# Patient Record
Sex: Male | Born: 1990 | Race: White | Hispanic: No | Marital: Married | Smoking: Former smoker
Health system: Southern US, Community
[De-identification: ages and names within clinical notes are randomized; demographics above are authoritative.]

## PROBLEM LIST (undated history)

## (undated) DIAGNOSIS — F32A Depression, unspecified: Secondary | ICD-10-CM

## (undated) DIAGNOSIS — I10 Essential (primary) hypertension: Secondary | ICD-10-CM

## (undated) DIAGNOSIS — E785 Hyperlipidemia, unspecified: Secondary | ICD-10-CM

## (undated) DIAGNOSIS — Z8701 Personal history of pneumonia (recurrent): Secondary | ICD-10-CM

## (undated) DIAGNOSIS — F329 Major depressive disorder, single episode, unspecified: Secondary | ICD-10-CM

## (undated) DIAGNOSIS — K760 Fatty (change of) liver, not elsewhere classified: Secondary | ICD-10-CM

## (undated) HISTORY — DX: Personal history of pneumonia (recurrent): Z87.01

## (undated) HISTORY — DX: Depression, unspecified: F32.A

## (undated) HISTORY — DX: Hyperlipidemia, unspecified: E78.5

## (undated) HISTORY — DX: Fatty (change of) liver, not elsewhere classified: K76.0

## (undated) HISTORY — DX: Major depressive disorder, single episode, unspecified: F32.9

## (undated) HISTORY — DX: Essential (primary) hypertension: I10

---

## 2014-05-12 ENCOUNTER — Encounter: Payer: Self-pay | Admitting: Family Medicine

## 2014-05-29 ENCOUNTER — Ambulatory Visit: Payer: BLUE CROSS/BLUE SHIELD | Admitting: Physician Assistant

## 2014-06-05 ENCOUNTER — Encounter: Payer: Self-pay | Admitting: Physician Assistant

## 2014-06-05 ENCOUNTER — Other Ambulatory Visit: Payer: Self-pay | Admitting: Physician Assistant

## 2014-06-05 ENCOUNTER — Ambulatory Visit (INDEPENDENT_AMBULATORY_CARE_PROVIDER_SITE_OTHER): Payer: BLUE CROSS/BLUE SHIELD | Admitting: Physician Assistant

## 2014-06-05 VITALS — BP 122/94 | HR 76 | Temp 98.3°F | Resp 18 | Ht >= 80 in | Wt >= 6400 oz

## 2014-06-05 DIAGNOSIS — F32A Depression, unspecified: Secondary | ICD-10-CM | POA: Insufficient documentation

## 2014-06-05 DIAGNOSIS — E669 Obesity, unspecified: Secondary | ICD-10-CM

## 2014-06-05 DIAGNOSIS — G4733 Obstructive sleep apnea (adult) (pediatric): Secondary | ICD-10-CM

## 2014-06-05 DIAGNOSIS — F329 Major depressive disorder, single episode, unspecified: Secondary | ICD-10-CM | POA: Insufficient documentation

## 2014-06-05 DIAGNOSIS — Z23 Encounter for immunization: Secondary | ICD-10-CM

## 2014-06-05 DIAGNOSIS — K219 Gastro-esophageal reflux disease without esophagitis: Secondary | ICD-10-CM | POA: Insufficient documentation

## 2014-06-05 DIAGNOSIS — Z0000 Encounter for general adult medical examination without abnormal findings: Secondary | ICD-10-CM

## 2014-06-05 LAB — CBC WITH DIFFERENTIAL/PLATELET
BASOS ABS: 0.1 10*3/uL (ref 0.0–0.1)
BASOS PCT: 1 % (ref 0–1)
Eosinophils Absolute: 0.1 10*3/uL (ref 0.0–0.7)
Eosinophils Relative: 2 % (ref 0–5)
HEMATOCRIT: 47.4 % (ref 39.0–52.0)
Hemoglobin: 16.2 g/dL (ref 13.0–17.0)
LYMPHS ABS: 2.4 10*3/uL (ref 0.7–4.0)
LYMPHS PCT: 47 % — AB (ref 12–46)
MCH: 28.6 pg (ref 26.0–34.0)
MCHC: 34.2 g/dL (ref 30.0–36.0)
MCV: 83.6 fL (ref 78.0–100.0)
MPV: 9.3 fL (ref 8.6–12.4)
Monocytes Absolute: 0.5 10*3/uL (ref 0.1–1.0)
Monocytes Relative: 9 % (ref 3–12)
NEUTROS PCT: 41 % — AB (ref 43–77)
Neutro Abs: 2.1 10*3/uL (ref 1.7–7.7)
Platelets: 233 10*3/uL (ref 150–400)
RBC: 5.67 MIL/uL (ref 4.22–5.81)
RDW: 13.9 % (ref 11.5–15.5)
WBC: 5.2 10*3/uL (ref 4.0–10.5)

## 2014-06-05 LAB — COMPLETE METABOLIC PANEL WITH GFR
ALBUMIN: 4.5 g/dL (ref 3.5–5.2)
ALK PHOS: 59 U/L (ref 39–117)
ALT: 92 U/L — AB (ref 0–53)
AST: 50 U/L — ABNORMAL HIGH (ref 0–37)
BUN: 15 mg/dL (ref 6–23)
CALCIUM: 9.5 mg/dL (ref 8.4–10.5)
CO2: 27 meq/L (ref 19–32)
Chloride: 107 mEq/L (ref 96–112)
Creat: 1.11 mg/dL (ref 0.50–1.35)
GFR, Est African American: >89 mL/min
GFR, Est Non African American: >89 mL/min
GLUCOSE: 89 mg/dL (ref 70–99)
POTASSIUM: 4.9 meq/L (ref 3.5–5.3)
SODIUM: 142 meq/L (ref 135–145)
Total Bilirubin: 0.6 mg/dL (ref 0.2–1.2)
Total Protein: 6.8 g/dL (ref 6.0–8.3)

## 2014-06-05 LAB — LIPID PANEL
Cholesterol: 204 mg/dL — ABNORMAL HIGH (ref 0–200)
HDL: 40 mg/dL (ref >39)
LDL Cholesterol: 142 mg/dL — ABNORMAL HIGH (ref 0–99)
Total CHOL/HDL Ratio: 5.1 Ratio
Triglycerides: 108 mg/dL (ref <150)
VLDL: 22 mg/dL (ref 0–40)

## 2014-06-05 MED ORDER — PAROXETINE HCL 20 MG PO TABS: 20.0000 mg | ORAL_TABLET | Freq: Every day | ORAL | Status: DC

## 2014-06-05 MED ORDER — SERTRALINE HCL 50 MG PO TABS: 50.0000 mg | ORAL_TABLET | Freq: Every day | ORAL | Status: DC

## 2014-06-05 MED ORDER — OMEPRAZOLE 20 MG PO CPDR: 20.0000 mg | DELAYED_RELEASE_CAPSULE | Freq: Every day | ORAL | Status: DC

## 2014-06-05 NOTE — Progress Notes (Signed)
Patient ID: Roberto [name] MRN: [id number], DOB: Sep 28, 1990 24 y.o. Date of Encounter: 06/05/2014, 11:07 AM    Chief Complaint: Physical (CPE)  HPI: 24 y.o. y/o white male here for CPE. He is also here as a new patient to establish care. As well he has several issues that he wanted to discuss today.  Today he is here with his girlfriend who I have seen as a patient recently.  One thing patient wants to discuss is depression. He says that it runs in his family and his mother and his brother both are on medications for depression.  Says that at times he will have decreased interest in doing things and decreased motivation to do things. He says that also he is easily angered and easily irritated by people doing "stupid stuff". Girlfriend states that he will sometimes get upset over one thing and then he will start getting upset about his life in general.  Says he then will want to go be by himself and not be involved with things much during that time. He then says that one example would be that it may be towards the end of the month when he doesn't have very much money so then he will start thinking that he doesn't like his job--- then he will start generalizing his thoughts to that nothing seems to be going his way, nothing is going to work out like he wants to. He will start feeling like everything is wrong. This will then "stress him out "and cause stress to the girlfriend and other family etc. Says that he will be this way for a week or 2 but then will go back to being "normal and chilled out" for period of time.  Patient states that he is also having heartburn symptoms. Says that often happens when he is at work. He has tried taking Tums and Rolaids without relief. Says that he does usually eat fast food. However it doesn't seem to really matter exactly which thing he eats-- regardless he seems to have a heartburn after eating.  He also wants to have evaluation because he thinks he may have  sleep apnea. He knows he snores loudly. Also he recently went to an urgent care because of a sore throat and they had felt that he may have sleep apnea but did not schedule any further evaluation and told him to follow-up here with that.   Review of Systems: Consitutional: No fever, chills, fatigue, night sweats, lymphadenopathy, or weight changes. Eyes: No visual changes, eye redness, or discharge. ENT/Mouth: Ears: No otalgia, tinnitus, hearing loss, discharge. Nose: No congestion, rhinorrhea, sinus pain, or epistaxis. Throat: No sore throat, post nasal drip, or teeth pain. Cardiovascular: No CP, palpitations, diaphoresis, DOE, edema, orthopnea, PND. Respiratory: No cough, hemoptysis, SOB, or wheezing. Gastrointestinal: No anorexia, dysphagia, nausea, vomiting, hematemesis, diarrhea, constipation, BRBPR, or melena. +for GERD--see HPI Genitourinary: No dysuria, frequency, urgency, hematuria, incontinence, nocturia, decreased urinary stream, discharge, impotence, or testicular pain/masses. Musculoskeletal: No decreased ROM, myalgias, stiffness, joint swelling, or weakness. Skin: No rash, erythema, lesion changes, pain, warmth, jaundice, or pruritis. Neurological: No headache, dizziness, syncope, seizures, tremors, memory loss, coordination problems, or paresthesias. Psychological: No anxiety, hallucinations, SI/HI.--+ for depression--see HPI Endocrine: No fatigue, polydipsia, polyphagia, polyuria, or known diabetes. All other systems were reviewed and are otherwise negative.  Past Medical History  Diagnosis Date  . H/O: pneumonia     as infant     History reviewed. No pertinent past surgical history.  Home Meds:  Outpatient Prescriptions Prior to Visit  Medication Sig Dispense Refill  . Acetaminophen-Caffeine (EXCEDRIN TENSION HEADACHE) 500-65 MG TABS Take by mouth.     No facility-administered medications prior to visit.    Allergies: No Known Allergies  History   Social History   . Marital Status: Single    Spouse Name: N/A    Number of Children: N/A  . Years of Education: N/A   Occupational History  . Not on file.   Social History Main Topics  . Smoking status: Former Smoker    Types: E-cigarettes    Quit date: 03/31/2014  . Smokeless tobacco: Never Used  . Alcohol Use: Yes  . Drug Use: No  . Sexual Activity: No   Other Topics Concern  . Not on file   Social History Narrative   Works Office manager.    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 Depression Mother   . Hearing loss Mother   . Hyperlipidemia Mother   . Hypertension Mother   . Miscarriages / [place] Mother   . Diabetes Father   . Hearing loss Father   . Hyperlipidemia Father   . Hypertension Father   . Alcohol abuse Brother   . Depression Brother   . Drug abuse Brother   . Depression Maternal Uncle   . Arthritis Maternal Grandmother   . Cancer Maternal Grandmother   . Depression Maternal Grandmother   . Hearing loss Maternal Grandmother   . Heart disease Maternal Grandmother   . Hypertension Maternal Grandmother   . Depression Maternal Grandfather   . Cancer Paternal Grandmother   . Heart disease Paternal Grandmother   . Alcohol abuse Paternal Grandfather   . Cancer Paternal Grandfather   . Heart disease Paternal Grandfather   . Hyperlipidemia Paternal Grandfather   . Hypertension Paternal Grandfather   . Miscarriages / Stillbirths Paternal Grandfather     retinitis pigmentosis    Physical Exam: Blood pressure 122/94, pulse 76, temperature 98.3 F (36.8 C), temperature source Oral, resp. rate 18, height  (2.057 m), weight 400 lb (181.439 kg).  General: Obese WM. Appears in no acute distress. HEENT: Normocephalic, atraumatic. Conjunctiva pink, sclera non-icteric. Pupils 2 mm constricting to 1 mm, round, regular, and equally reactive to light and accomodation. EOMI. Internal auditory canal  clear. TMs with good cone of light and without pathology. Nasal mucosa pink. Nares are without discharge. No sinus tenderness. Oral mucosa pink. Dentition normal. Pharynx without exudate.   Neck: Supple. Trachea midline. No thyromegaly. Full ROM. No lymphadenopathy. Lungs: Clear to auscultation bilaterally without wheezes, rales, or rhonchi. Breathing is of normal effort and unlabored. Cardiovascular: RRR with S1 S2. No murmurs, rubs, or gallops. Distal pulses 2+ symmetrically. No carotid or abdominal bruits. Abdomen: Soft, non-tender, non-distended with normoactive bowel sounds. No hepatosplenomegaly or masses. No rebound/guarding. No CVA tenderness. No hernias. Musculoskeletal: Full range of motion and 5/5 strength throughout. Skin: Warm and moist without erythema, ecchymosis, wounds, or rash. Neuro: A+Ox3. CN II-XII grossly intact. Moves all extremities spontaneously. Full sensation throughout. Normal gait. Psych:  Responds to questions appropriately with a normal affect.   Assessment/Plan:  24 y.o. y/o white male here for CPE  -1. Visit for preventive health examination  A. Screening Labs: - CBC with Differential/Platelet - COMPLETE METABOLIC PANEL WITH GFR - Lipid panel   B. Immunizations: Flu---------------- patient defers. Says that he "never gets a flu shot " Tetanus-----------  patient reports that last tetanus was probably 10 years ago. Agreeable to update today.--Given here 06/05/2014 Pneumococcal-----no indication to need this prior to age 24 Zostavax------------- not indicated until age 24     2. Depression He states that his brother is on Paxil.  Patient,  Himself,  has never been on any medications in the past to know how they affect him. Given that Paxil does work for his brother and does not cause his brother adverse effects, weill go ahead with using Paxil for patient. Explained to patient that if he thinks medication is causing any adverse effects to call us  immediately. Otherwise, even if he does not think medication is helping, continue taking it daily until his follow-up appointment in 6 weeks. Discussed proper expectations of medication at length. - PARoxetine (PAXIL) 20 MG tablet; Take 1 tablet (20 mg total) by mouth daily.  Dispense: 30 tablet; Refill: 1  3. Obstructive sleep apnea - Nocturnal polysomnography (NPSG); Future  4. Gastroesophageal reflux disease, esophagitis presence not specified Discussed trying to avoid fried spicy foods and other foods that seem to irritate his reflux. Otherwise can use omeprazole as needed. - omeprazole (PRILOSEC) 20 MG capsule; Take 1 capsule (20 mg total) by mouth daily.  Dispense: 30 capsule; Refill: 3  5. Obesity Did not discuss his weight, diet and exercise at his initial visit. Will discuss this further at his follow-up visit.  6. Need for Tdap vaccination - Tdap vaccine greater than or equal to 7yo IM   Follow-up office visit in 6 weeks or sooner if needed.  Signed:   [address]Mary Beth ArdmoreDixon,[place], [place]  06/05/2014 11:07 AM

## 2014-06-07 ENCOUNTER — Telehealth: Payer: Self-pay | Admitting: Family Medicine

## 2014-06-07 DIAGNOSIS — R7989 Other specified abnormal findings of blood chemistry: Secondary | ICD-10-CM

## 2014-06-07 DIAGNOSIS — R945 Abnormal results of liver function studies: Principal | ICD-10-CM

## 2014-06-07 LAB — HEPATITIS PANEL, ACUTE
HCV Ab: NEGATIVE
HEP A IGM: NONREACTIVE
HEP B C IGM: NONREACTIVE
Hepatitis B Surface Ag: NEGATIVE

## 2014-06-07 NOTE — Telephone Encounter (Signed)
Pt aware of lab results and provider recommendations.  Order for US entered.

## 2014-06-07 NOTE — Telephone Encounter (Signed)
-----   Message from Dorena BodoMary B Dixon, PA-C sent at 06/07/2014  7:52 AM EST ----- Elevated liver function test are most likely secondary to patient's obesity  (weight 400 pounds)--causing fatty liver. Since this has not been evaluated, will have lab add hepatitis panel and will go ahead and order liver ultrasound. I will talk to the lab about adding hepatitis panel. Jaclin Finks-- Please Place order for ultrasound right upper quadrant. Tell patient to decrease saturated fats in diet. Needs to eat lean meats and vegetables. Add exercise if possible. Other labs are ok.

## 2014-06-12 ENCOUNTER — Ambulatory Visit
Admission: RE | Admit: 2014-06-12 | Discharge: 2014-06-12 | Disposition: A | Discharge status: Home / Self Care | Payer: BC Managed Care – PPO | Source: Ambulatory Visit | Attending: Physician Assistant | Admitting: Physician Assistant

## 2014-06-12 ENCOUNTER — Encounter: Payer: Self-pay | Admitting: Family Medicine

## 2014-06-12 DIAGNOSIS — R7989 Other specified abnormal findings of blood chemistry: Secondary | ICD-10-CM | POA: Insufficient documentation

## 2014-06-12 DIAGNOSIS — R945 Abnormal results of liver function studies: Secondary | ICD-10-CM

## 2014-07-17 ENCOUNTER — Ambulatory Visit: Payer: BLUE CROSS/BLUE SHIELD | Admitting: Physician Assistant

## 2014-07-17 ENCOUNTER — Ambulatory Visit: Payer: BC Managed Care – PPO | Admitting: Family Medicine

## 2014-07-27 ENCOUNTER — Encounter: Payer: Self-pay | Admitting: Family Medicine

## 2014-07-27 ENCOUNTER — Ambulatory Visit (INDEPENDENT_AMBULATORY_CARE_PROVIDER_SITE_OTHER): Payer: BC Managed Care – PPO | Admitting: Family Medicine

## 2014-07-27 VITALS — BP 124/78 | HR 78 | Temp 98.3°F | Resp 16 | Ht >= 80 in | Wt 399.0 lb

## 2014-07-27 DIAGNOSIS — F32A Depression, unspecified: Secondary | ICD-10-CM

## 2014-07-27 DIAGNOSIS — M7042 Prepatellar bursitis, left knee: Secondary | ICD-10-CM | POA: Diagnosis not present

## 2014-07-27 DIAGNOSIS — K219 Gastro-esophageal reflux disease without esophagitis: Secondary | ICD-10-CM

## 2014-07-27 DIAGNOSIS — F329 Major depressive disorder, single episode, unspecified: Secondary | ICD-10-CM

## 2014-07-27 MED ORDER — DICLOFENAC SODIUM 75 MG PO TBEC: 75.0000 mg | DELAYED_RELEASE_TABLET | Freq: Two times a day (BID) | ORAL | Status: DC

## 2014-07-27 NOTE — Progress Notes (Signed)
Subjective:    Patient ID: Roberto MoynahanDavid [name], male    DOB: 06/17/1990, 24 y.o.   MRN: [id number]  HPI  Patient is here today for follow-up. When he last saw Shon HaleMary Beth 6 weeks ago he was started on Paxil 20 mg a day for depression. He states he is doing some better. He continues to complain of low energy, apathy, and lack of desire to do anything. Otherwise the depression is slightly better.  Patient injured his left knee in January in a snowboarding accident.  Patient has a localized area of fluid just anterior to the patella. Patient has swelling in the prepatellar bursa. There is no swelling in the knee joint itself. There is no bruising in the knee joint. There is no pain with range of motion in the knee joint. The patient denies any clicking or grinding.  Patient's acid reflux is gotten better on omeprazole. Past Medical History  Diagnosis Date  . H/O: pneumonia     as infant   No past surgical history on file. Current Outpatient Prescriptions on File Prior to Visit  Medication Sig Dispense Refill  . Acetaminophen-Caffeine (EXCEDRIN TENSION HEADACHE) 500-65 MG TABS Take by mouth.    Marland Kitchen. omeprazole (PRILOSEC) 20 MG capsule Take 1 capsule (20 mg total) by mouth daily. 30 capsule 3  . PARoxetine (PAXIL) 20 MG tablet Take 1 tablet (20 mg total) by mouth daily. 30 tablet 1   No current facility-administered medications on file prior to visit.   No Known Allergies History   Social History  . Marital Status: Single    Spouse Name: N/A  . Number of Children: N/A  . Years of Education: N/A   Occupational History  . Not on file.   Social History Main Topics  . Smoking status: Former Smoker    Types: E-cigarettes    Quit date: 03/31/2014  . Smokeless tobacco: Never Used  . Alcohol Use: Yes  . Drug Use: No  . Sexual Activity: No   Other Topics Concern  . Not on file   Social History Narrative   Works Office managerDesk Job.    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      Objective:   Physical Exam  Cardiovascular: Normal rate, regular rhythm and normal heart sounds.   No murmur heard. Pulmonary/Chest: Effort normal and breath sounds normal. No respiratory distress. He has no wheezes. He has no rales.  Abdominal: Soft. Bowel sounds are normal. He exhibits no distension. There is no tenderness. There is no rebound.  Musculoskeletal:       Left knee: He exhibits swelling. He exhibits normal range of motion, no effusion, no deformity, normal alignment, no LCL laxity, normal patellar mobility, no bony tenderness, normal meniscus and no MCL laxity. No medial joint line, no lateral joint line, no MCL and no LCL tenderness noted.  Vitals reviewed.         Assessment & Plan:  Prepatellar bursitis, left - Plan: diclofenac (VOLTAREN) 75 MG EC tablet  Gastroesophageal reflux disease without esophagitis  Depression  A she has prepatellar bursitis. I will treat that with diclofenac 75 mg by mouth twice a day. Recheck in 2-3 weeks if no better for possible cortisone injection and aspiration of the prepatellar bursa. Continue omeprazole 20 mg by mouth daily for acid reflux. Also recommended weight loss. The patient will also continue Paxil 20 mg by mouth  daily for the next 2-4 weeks. If no better at that point I would switch the patient to Effexor XR to try to improve his energy and better manage his apathy

## 2014-08-07 ENCOUNTER — Telehealth: Payer: Self-pay | Admitting: Physician Assistant

## 2014-08-07 MED ORDER — VENLAFAXINE HCL ER 75 MG PO CP24: ORAL_CAPSULE | ORAL | Status: DC

## 2014-08-07 NOTE — Telephone Encounter (Signed)
Switch from Paxil 20 mg a day to Effexor XR 75 mg a day. Take the pill in the morning. In 2 weeks increase to 150 mg a day. Recheck here in one month

## 2014-08-07 NOTE — Telephone Encounter (Signed)
Roberto [name]   Patient is calling to get rx for his depression, however he was taking paxil, and as mentioned in ov notes from [facility] thinks he would like to switch to Effexor if possible  [phone]

## 2014-08-07 NOTE — Telephone Encounter (Signed)
Pt would like to switch to Effexor as discussed at last OV.  Please advise?

## 2014-08-07 NOTE — Telephone Encounter (Signed)
Spoke to patient.  Aware of medication change and how to take.  One month follow up appt made.  Rx to pharmacy

## 2014-08-20 ENCOUNTER — Ambulatory Visit (HOSPITAL_BASED_OUTPATIENT_CLINIC_OR_DEPARTMENT_OTHER): Payer: BC Managed Care – PPO | Attending: Physician Assistant | Admitting: Radiology

## 2014-08-20 VITALS — Ht >= 80 in | Wt >= 6400 oz

## 2014-08-20 DIAGNOSIS — G4733 Obstructive sleep apnea (adult) (pediatric): Secondary | ICD-10-CM

## 2014-08-20 DIAGNOSIS — R0683 Snoring: Secondary | ICD-10-CM | POA: Diagnosis not present

## 2014-08-20 DIAGNOSIS — G4710 Hypersomnia, unspecified: Secondary | ICD-10-CM | POA: Insufficient documentation

## 2014-08-25 DIAGNOSIS — G4733 Obstructive sleep apnea (adult) (pediatric): Secondary | ICD-10-CM | POA: Diagnosis not present

## 2014-08-26 NOTE — Sleep Study (Signed)
   NAME: Letta MoynahanDavid Lafortune DATE OF BIRTH:  08/20/1990 MEDICAL RECORD NUMBER [id number]  LOCATION: [facility]  PHYSICIAN: Alven Alverio D  DATE OF STUDY: 08/20/2014  SLEEP STUDY TYPE: Nocturnal Polysomnogram               REFERRING PHYSICIAN: Allayne Butcherixon, Mary B, PA-C  INDICATION FOR STUDY: Hypersomnia with sleep apnea  EPWORTH SLEEPINESS SCORE:   19/24 HEIGHT: 6\' 9"  (205.7 cm)  WEIGHT: (!) 400 lb (181.439 kg)    Body mass index is 42.88 kg/(m^2).  NECK SIZE: 17 in.  MEDICATIONS: Charted for review  SLEEP ARCHITECTURE: Total sleep time 287.5 minutes with sleep efficiency 73.7%. Stage I was 17.9%, stage II 76.9%, stage III 5.2%, REM absent. Sleep latency 31.5 minutes, awake after sleep onset 66.5 minutes, arousal index 33.6, bedtime medication: None  RESPIRATORY DATA: Apnea hypopnea index (AHI) 2.3 per hour. 11 total events scored, all as hypopneas while supine.  OXYGEN DATA: Moderately loud snoring with oxygen desaturation to a nadir of 90% and mean saturation 94.1% on room air  CARDIAC DATA: Normal sinus rhythm  MOVEMENT/PARASOMNIA: No significant movement disturbance, bathroom 1. One episode of bruxism.  IMPRESSION/ RECOMMENDATION:   1) Sleep architecture was somewhat fragmented by frequent brief awakenings throughout the study, unrelated to respiratory or movement disturbance. Consider management as insomnia. 2) Occasional respiratory event with sleep disturbance, within normal limits. AHI 2.3 per hour representing a few supine hypopneas. Moderately loud snoring with oxygen desaturation to a nadir of 90% and mean saturation 94.1% on room air   Waymon BudgeYOUNG,Netasha Wehrli D Diplomate, American Board of Sleep Medicine  ELECTRONICALLY SIGNED ON:  08/26/2014, 1:55 PM [facility] PH: [phone]   FX: [phone] ACCREDITED BY THE AMERICAN ACADEMY OF SLEEP MEDICINE

## 2014-08-30 ENCOUNTER — Telehealth: Payer: Self-pay | Admitting: Family Medicine

## 2014-08-30 NOTE — Telephone Encounter (Signed)
Pt aware of Sleep Study results

## 2014-09-01 ENCOUNTER — Other Ambulatory Visit: Payer: Self-pay | Admitting: Family Medicine

## 2014-09-01 NOTE — Telephone Encounter (Signed)
One refill to hold until up coming appt. 

## 2014-09-05 ENCOUNTER — Ambulatory Visit (INDEPENDENT_AMBULATORY_CARE_PROVIDER_SITE_OTHER): Payer: BC Managed Care – PPO | Admitting: Family Medicine

## 2014-09-05 ENCOUNTER — Encounter: Payer: Self-pay | Admitting: Family Medicine

## 2014-09-05 VITALS — BP 140/90 | HR 88 | Temp 98.4°F | Resp 16 | Ht >= 80 in | Wt 393.0 lb

## 2014-09-05 DIAGNOSIS — F329 Major depressive disorder, single episode, unspecified: Secondary | ICD-10-CM

## 2014-09-05 DIAGNOSIS — M79671 Pain in right foot: Secondary | ICD-10-CM | POA: Diagnosis not present

## 2014-09-05 DIAGNOSIS — F32A Depression, unspecified: Secondary | ICD-10-CM

## 2014-09-05 NOTE — Progress Notes (Signed)
Subjective:    Patient ID: Roberto [name], male    DOB: 08/24/1990, 24 y.o.   MRN: [id number]  HPI  07/27/14 Patient is here today for follow-up. When he last saw Shon HaleMary Beth 6 weeks ago he was started on Paxil 20 mg a day for depression. He states he is doing some better. He continues to complain of low energy, apathy, and lack of desire to do anything. Otherwise the depression is slightly better.  Patient injured his left knee in January in a snowboarding accident.  Patient has a localized area of fluid just anterior to the patella. Patient has swelling in the prepatellar bursa. There is no swelling in the knee joint itself. There is no bruising in the knee joint. There is no pain with range of motion in the knee joint. The patient denies any clicking or grinding.  Patient's acid reflux is gotten better on omeprazole.  AT that time, my plan was: He has prepatellar bursitis. I will treat that with diclofenac 75 mg by mouth twice a day. Recheck in 2-3 weeks if no better for possible cortisone injection and aspiration of the prepatellar bursa. Continue omeprazole 20 mg by mouth daily for acid reflux. Also recommended weight loss. The patient will also continue Paxil 20 mg by mouth daily for the next 2-4 weeks. If no better at that point I would switch the patient to Effexor XR to try to improve his energy and better manage his apathy.  On 4/4, the patient called and asked to switch paxil to Effexor XR 75 mg poqday with plans to increase to 150 mg poqam after two weeks.  He is here today for follow up.  09/05/14 Patient symptoms of depression have improved. His energy level has improved. Overall he is feeling much better on the Effexor. Unfortunately the frequency of his headaches have increased. He has a past medical history of migraines and he is getting more frequent also call headaches later in the day at work fortunately his sleep study was normal. There is no evidence of sleep apnea. He also continues to  complain of pain in the right first MTP joint. He denies any injury. It did not improve with diclofenac. Past Medical History  Diagnosis Date  . H/O: pneumonia     as infant   No past surgical history on file. Current Outpatient Prescriptions on File Prior to Visit  Medication Sig Dispense Refill  . Acetaminophen-Caffeine (EXCEDRIN TENSION HEADACHE) 500-65 MG TABS Take by mouth.    . diclofenac (VOLTAREN) 75 MG EC tablet Take 1 tablet (75 mg total) by mouth 2 (two) times daily. 30 tablet 0  . omeprazole (PRILOSEC) 20 MG capsule Take 1 capsule (20 mg total) by mouth daily. 30 capsule 3  . venlafaxine XR (EFFEXOR-XR) 75 MG 24 hr capsule TAKE ONE CAPSULE BY MOUTH EVERY MORNING X2 WEEK THEN INCREASE TO 2 CAPS EVERY MORNING 60 capsule 0   No current facility-administered medications on file prior to visit.   No Known Allergies History   Social History  . Marital Status: Single    Spouse Name: N/A  . Number of Children: N/A  . Years of Education: N/A   Occupational History  . Not on file.   Social History Main Topics  . Smoking status: Former Smoker    Types: E-cigarettes    Quit date: 03/31/2014  . Smokeless tobacco: Never Used  . Alcohol Use: Yes  . Drug Use: No  . Sexual Activity: No   Other  Topics Concern  . Not on file   Social History Narrative   Works Office manager.    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      Objective:   Physical Exam  Cardiovascular: Normal rate, regular rhythm and normal heart sounds.   No murmur heard. Pulmonary/Chest: Effort normal and breath sounds normal. No respiratory distress. He has no wheezes. He has no rales.  Abdominal: Soft. Bowel sounds are normal. He exhibits no distension. There is no tenderness. There is no rebound.  Vitals reviewed.         Assessment & Plan:  Depression  Foot pain, right - Plan: DG Foot Complete  Right  Patient's depression is better. Continue Effexor XR 150 mg by mouth daily. Recheck in one month. If patient continues to have daily migraines and also continues to struggle with obesity, we can try Topamax for weight loss as well as migraine prevention. I would like the patient to get an x-ray of his right foot to evaluate for the pain he is having in the right first MTP joint

## 2014-09-26 ENCOUNTER — Encounter: Payer: Self-pay | Admitting: *Deleted

## 2014-10-11 ENCOUNTER — Other Ambulatory Visit: Payer: Self-pay | Admitting: Family Medicine

## 2014-10-11 MED ORDER — VENLAFAXINE HCL ER 150 MG PO CP24: 150.0000 mg | ORAL_CAPSULE | Freq: Every day | ORAL | Status: DC

## 2014-10-11 NOTE — Telephone Encounter (Signed)
Refill appropriate and filled per protocol. 

## 2014-11-09 ENCOUNTER — Encounter: Payer: Self-pay | Admitting: Family Medicine

## 2014-11-09 ENCOUNTER — Ambulatory Visit (INDEPENDENT_AMBULATORY_CARE_PROVIDER_SITE_OTHER): Payer: BC Managed Care – PPO | Admitting: Family Medicine

## 2014-11-09 VITALS — BP 118/84 | HR 110 | Temp 98.3°F | Resp 18 | Ht >= 80 in | Wt 370.0 lb

## 2014-11-09 DIAGNOSIS — K529 Noninfective gastroenteritis and colitis, unspecified: Secondary | ICD-10-CM

## 2014-11-09 MED ORDER — DIPHENOXYLATE-ATROPINE 2.5-0.025 MG PO TABS: 2.0000 | ORAL_TABLET | Freq: Four times a day (QID) | ORAL | Status: DC | PRN

## 2014-11-09 NOTE — Progress Notes (Signed)
   Subjective:    Patient ID: Roberto [name], male    DOB: 09/27/1990, 24 y.o.   MRN: [id number]  HPI Symptoms began 3 days ago. He is having 10-15 watery bowel movements per day. He has also having some mild intestinal cramps. He also has some mild nausea. He denies any fever. He is not vomiting. He denies any melanotic or hematochezia. The symptoms seem to be improving. He denies any abdominal pain. He denies any travel. He denies drinking or eating any suspicious foods or liquids or he may have consumed fecal material inadvertently. Past Medical History  Diagnosis Date  . H/O: pneumonia     as infant   No past surgical history on file. Current Outpatient Prescriptions on File Prior to Visit  Medication Sig Dispense Refill  . Acetaminophen-Caffeine (EXCEDRIN TENSION HEADACHE) 500-65 MG TABS Take by mouth.    Marland Kitchen. omeprazole (PRILOSEC) 20 MG capsule Take 1 capsule (20 mg total) by mouth daily. 30 capsule 3  . venlafaxine XR (EFFEXOR-XR) 150 MG 24 hr capsule Take 150 mg by mouth daily with breakfast.     No current facility-administered medications on file prior to visit.   No Known Allergies History   Social History  . Marital Status: Single    Spouse Name: N/A  . Number of Children: N/A  . Years of Education: N/A   Occupational History  . Not on file.   Social History Main Topics  . Smoking status: Former Smoker    Types: E-cigarettes    Quit date: 03/31/2014  . Smokeless tobacco: Never Used  . Alcohol Use: Yes  . Drug Use: No  . Sexual Activity: No   Other Topics Concern  . Not on file   Social History Narrative   Works Office managerDesk Job.    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      Objective:   Physical Exam  Constitutional: He appears well-developed and well-nourished. No distress.  Cardiovascular: Normal rate, regular rhythm and normal heart sounds.   Pulmonary/Chest:  Effort normal and breath sounds normal. No respiratory distress. He has no wheezes. He has no rales.  Abdominal: Soft. Bowel sounds are normal. He exhibits no distension. There is no tenderness. There is no rebound and no guarding.  Skin: He is not diaphoretic.  Vitals reviewed.         Assessment & Plan:  Gastroenteritis, acute - Plan: diphenoxylate-atropine (LOMOTIL) 2.5-0.025 MG per tablet  Patient has viral gastroenteritis. I recommended a bland diet. I recommended the patient push fluids. I will give the patient Lomotil 2 tablets by mouth every 6 hours when necessary diarrhea. I anticipate gradual improvement over the next 2-3 days. Recheck immediately if worse

## 2014-11-20 ENCOUNTER — Telehealth: Payer: Self-pay | Admitting: Family Medicine

## 2014-11-20 NOTE — Telephone Encounter (Signed)
PA requested for Omeprazole  Submitted through "CoverMyMeds"  Case [id number]

## 2014-11-21 NOTE — Telephone Encounter (Signed)
Omeprazole has been approved 10/21/14 - 11/20/15.  Case # [id number].  Pharmacy made aware.

## 2014-12-29 ENCOUNTER — Other Ambulatory Visit: Payer: Self-pay | Admitting: Family Medicine

## 2014-12-29 NOTE — Telephone Encounter (Signed)
Medication refilled per protocol. 

## 2015-01-13 ENCOUNTER — Other Ambulatory Visit: Payer: Self-pay | Admitting: Physician Assistant

## 2015-01-15 NOTE — Telephone Encounter (Signed)
Medication refilled per protocol. 

## 2015-03-07 ENCOUNTER — Other Ambulatory Visit: Payer: Self-pay | Admitting: Family Medicine

## 2015-03-07 NOTE — Telephone Encounter (Signed)
Refill appropriate and filled per protocol. 

## 2015-11-10 ENCOUNTER — Other Ambulatory Visit: Payer: Self-pay | Admitting: Family Medicine

## 2016-01-10 ENCOUNTER — Encounter: Payer: Self-pay | Admitting: Family Medicine

## 2016-01-10 ENCOUNTER — Ambulatory Visit (INDEPENDENT_AMBULATORY_CARE_PROVIDER_SITE_OTHER): Payer: BC Managed Care – PPO | Admitting: Family Medicine

## 2016-01-10 VITALS — BP 144/100 | HR 84 | Temp 98.3°F | Resp 18 | Ht >= 80 in | Wt 393.0 lb

## 2016-01-10 DIAGNOSIS — F329 Major depressive disorder, single episode, unspecified: Secondary | ICD-10-CM | POA: Diagnosis not present

## 2016-01-10 DIAGNOSIS — F32A Depression, unspecified: Secondary | ICD-10-CM

## 2016-01-10 MED ORDER — BUPROPION HCL ER (XL) 150 MG PO TB24: 150.0000 mg | ORAL_TABLET | Freq: Every day | ORAL | 3 refills | Status: DC

## 2016-01-10 NOTE — Progress Notes (Signed)
Subjective:    Patient ID: Roberto [name], male    DOB: [birth year], 25 y.o.   MRN: [id number]  HPI 07/27/14 Patient is here today for follow-up. When he last saw Shon Hale 6 weeks ago he was started on Paxil 20 mg a day for depression. He states he is doing some better. He continues to complain of low energy, apathy, and lack of desire to do anything. Otherwise the depression is slightly better.  Patient injured his left knee in January in a snowboarding accident.  Patient has a localized area of fluid just anterior to the patella. Patient has swelling in the prepatellar bursa. There is no swelling in the knee joint itself. There is no bruising in the knee joint. There is no pain with range of motion in the knee joint. The patient denies any clicking or grinding.  Patient's acid reflux is gotten better on omeprazole.  AT that time, my plan was: He has prepatellar bursitis. I will treat that with diclofenac 75 mg by mouth twice a day. Recheck in 2-3 weeks if no better for possible cortisone injection and aspiration of the prepatellar bursa. Continue omeprazole 20 mg by mouth daily for acid reflux. Also recommended weight loss. The patient will also continue Paxil 20 mg by mouth daily for the next 2-4 weeks. If no better at that point I would switch the patient to Effexor XR to try to improve his energy and better manage his apathy.  On 4/4, the patient called and asked to switch paxil to Effexor XR 75 mg poqday with plans to increase to 150 mg poqam after two weeks.  He is here today for follow up.  09/05/14 Patient symptoms of depression have improved. His energy level has improved. Overall he is feeling much better on the Effexor. Unfortunately the frequency of his headaches have increased. He has a past medical history of migraines and he is getting more frequent also call headaches later in the day at work fortunately his sleep study was normal. There is no evidence of sleep apnea. He also continues to  complain of pain in the right first MTP joint. He denies any injury. It did not improve with diclofenac.  At that time, my plan was: Patient's depression is better. Continue Effexor XR 150 mg by mouth daily. Recheck in one month. If patient continues to have daily migraines and also continues to struggle with obesity, we can try Topamax for weight loss as well as migraine prevention. I would like the patient to get an x-ray of his right foot to evaluate for the pain he is having in the right first MTP joint  01/10/16 Patient no longer feels that the Effexor is working well. He reports worsening anhedonia, worsening apathy, low energy, worsening feelings of depression. He denies any suicidal ideation. He denies any homicidal ideation. He denies any delusions or paranoia. He denies any symptoms of mania. He does report withdrawal symptoms if he does not take his Effexor within 24 hours. He has no history of seizures Past Medical History:  Diagnosis Date  . H/O: pneumonia    as infant   No past surgical history on file. Current Outpatient Prescriptions on File Prior to Visit  Medication Sig Dispense Refill  . Acetaminophen-Caffeine (EXCEDRIN TENSION HEADACHE) 500-65 MG TABS Take by mouth.    Marland Kitchen omeprazole (PRILOSEC) 20 MG capsule TAKE 1 CAPSULE (20 MG TOTAL) BY MOUTH DAILY. 90 capsule 1  . venlafaxine XR (EFFEXOR-XR) 150 MG 24 hr capsule TAKE  1 CAPSULE (150 MG TOTAL) BY MOUTH DAILY WITH BREAKFAST. 30 capsule 1   No current facility-administered medications on file prior to visit.    No Known Allergies Social History   Social History  . Marital status: Single    Spouse name: N/A  . Number of children: N/A  . Years of education: N/A   Occupational History  . Not on file.   Social History Main Topics  . Smoking status: Former Smoker    Types: E-cigarettes    Quit date: 03/31/2014  . Smokeless tobacco: Never Used  . Alcohol use Yes  . Drug use: No  . Sexual activity: No   Other Topics  Concern  . Not on file   Social History Narrative   Works Office managerDesk Job.    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      Objective:   Physical Exam  Cardiovascular: Normal rate, regular rhythm and normal heart sounds.   No murmur heard. Pulmonary/Chest: Effort normal and breath sounds normal. No respiratory distress. He has no wheezes. He has no rales.  Abdominal: Soft. Bowel sounds are normal. He exhibits no distension. There is no tenderness. There is no rebound.  Vitals reviewed.         Assessment & Plan:  Depression Continue Effexor XR 150 mg by mouth every morning. Augment with Wellbutrin XL 150 mg by mouth every morning. Recheck in one month

## 2016-01-12 ENCOUNTER — Other Ambulatory Visit: Payer: Self-pay | Admitting: Family Medicine

## 2016-02-05 ENCOUNTER — Encounter: Payer: Self-pay | Admitting: Family Medicine

## 2016-02-05 ENCOUNTER — Ambulatory Visit (INDEPENDENT_AMBULATORY_CARE_PROVIDER_SITE_OTHER): Payer: BC Managed Care – PPO | Admitting: Family Medicine

## 2016-02-05 VITALS — BP 144/88 | HR 84 | Temp 98.6°F | Resp 16 | Ht >= 80 in | Wt 389.0 lb

## 2016-02-05 DIAGNOSIS — F331 Major depressive disorder, recurrent, moderate: Secondary | ICD-10-CM

## 2016-02-05 DIAGNOSIS — M542 Cervicalgia: Secondary | ICD-10-CM

## 2016-02-05 MED ORDER — OMEPRAZOLE 20 MG PO CPDR: DELAYED_RELEASE_CAPSULE | ORAL | 1 refills | Status: DC

## 2016-02-05 NOTE — Assessment & Plan Note (Signed)
disccussed diaphoresis SE of the wellbutrin, not harmful but we can try a different medication if bothersome, He declines would like to stick with current regimen of Effexor and wellbutrin

## 2016-02-05 NOTE — Progress Notes (Signed)
   Subjective:    Patient ID: Roberto MoynahanDavid Pickup, male    DOB: 04/17/1991, 25 y.o.   MRN: [id number]  Patient presents for Neck Pain (x1 month- dnies injury to neck- reports that he notices it most after working in front of the computer- is using biofreeze) and Medication Management (discuss antidepressant)  Depression- currently on effexor and wellbutrin, feels his mood has improved less of the severe depressed spells where he stays in his room all week. He is also moving to another position at his job which will make him more active, he will also have better pay. His only concern is increased sweating over the past 2-3 weeks.  Neck pain for the past month- no injury, sits at desk all day, uses biofreeze, heating pad when it flares up which helps. Denies tingling numbness in fingertips, no shoulder pain     Review Of Systems:  GEN- denies fatigue, fever, weight loss,weakness, recent illness HEENT- denies eye drainage, change in vision, nasal discharge, CVS- denies chest pain, palpitations RESP- denies SOB, cough, wheeze ABD- denies N/V, change in stools, abd pain GU- denies dysuria, hematuria, dribbling, incontinence MSK- + joint pain, muscle aches, injury Neuro- denies headache, dizziness, syncope, seizure activity       Objective:    BP (!) 144/88 (BP Location: Right Arm, Patient Position: Sitting, Cuff Size: Large)   Pulse 84   Temp 98.6 F (37 C) (Oral)   Resp 16   Ht 6\' 9"  (2.057 m)   Wt (!) 389 lb (176.4 kg)   BMI 41.69 kg/m  GEN- NAD, alert and oriented x3 HEENT- PERRL, EOMI, non injected sclera, pink conjunctiva, MMM, oropharynx clear Neck- Supple, FROM, mild TTP cervical paraspinals, Spine NT, no spasm noted, neg spurlings CVS- RRR, no murmur RESP-CTAB Psych- normal affect and mood MSK- FROM upper ext , normal tone UE  Pulses- Radial- 2+        Assessment & Plan:      Problem List Items Addressed This Visit    Depression    disccussed diaphoresis SE of the  wellbutrin, not harmful but we can try a different medication if bothersome, He declines would like to stick with current regimen of Effexor and wellbutrin       Other Visit Diagnoses    Neck pain    -  Primary   MSK pain, no red flags, recommend oral NSAIDS, heating pad. Also needs increased activity weight loss      Note: This dictation was prepared with Dragon dictation along with smaller phrase technology. Any transcriptional errors that result from this process are unintentional.

## 2016-02-05 NOTE — Patient Instructions (Addendum)
Work on diet and exercise Continue your current medications Use Aleve F/U Dr. Tanya NonesPickard  2 months

## 2016-03-05 ENCOUNTER — Emergency Department (HOSPITAL_COMMUNITY)
Admission: EM | Admit: 2016-03-05 | Discharge: 2016-03-05 | Disposition: A | Discharge status: Home / Self Care | Payer: Worker's Compensation | Attending: Emergency Medicine | Admitting: Emergency Medicine

## 2016-03-05 ENCOUNTER — Encounter (HOSPITAL_COMMUNITY): Payer: Self-pay

## 2016-03-05 DIAGNOSIS — X500XXA Overexertion from strenuous movement or load, initial encounter: Secondary | ICD-10-CM | POA: Diagnosis not present

## 2016-03-05 DIAGNOSIS — M5416 Radiculopathy, lumbar region: Secondary | ICD-10-CM | POA: Diagnosis not present

## 2016-03-05 DIAGNOSIS — Y93F2 Activity, caregiving, lifting: Secondary | ICD-10-CM | POA: Diagnosis not present

## 2016-03-05 DIAGNOSIS — Y990 Civilian activity done for income or pay: Secondary | ICD-10-CM | POA: Insufficient documentation

## 2016-03-05 DIAGNOSIS — Y9289 Other specified places as the place of occurrence of the external cause: Secondary | ICD-10-CM | POA: Diagnosis not present

## 2016-03-05 DIAGNOSIS — Z87891 Personal history of nicotine dependence: Secondary | ICD-10-CM | POA: Insufficient documentation

## 2016-03-05 DIAGNOSIS — M545 Low back pain: Secondary | ICD-10-CM | POA: Diagnosis present

## 2016-03-05 DIAGNOSIS — M5410 Radiculopathy, site unspecified: Secondary | ICD-10-CM

## 2016-03-05 MED ORDER — OXYCODONE HCL 5 MG PO TABS: 2.5000 mg | ORAL_TABLET | ORAL | 0 refills | Status: DC | PRN

## 2016-03-05 MED ORDER — KETOROLAC TROMETHAMINE 60 MG/2ML IM SOLN
60.0000 mg | Freq: Once | INTRAMUSCULAR | Status: AC
  Administered 2016-03-05: 60 mg via INTRAMUSCULAR
  Filled 2016-03-05: qty 2

## 2016-03-05 MED ORDER — NAPROXEN 500 MG PO TABS: 500.0000 mg | ORAL_TABLET | Freq: Two times a day (BID) | ORAL | 0 refills | Status: DC

## 2016-03-05 MED ORDER — PREDNISONE 20 MG PO TABS: ORAL_TABLET | ORAL | 0 refills | Status: DC

## 2016-03-05 MED ORDER — DEXAMETHASONE SODIUM PHOSPHATE 10 MG/ML IJ SOLN
10.0000 mg | Freq: Once | INTRAMUSCULAR | Status: AC
  Administered 2016-03-05: 10 mg via INTRAMUSCULAR
  Filled 2016-03-05: qty 1

## 2016-03-05 NOTE — ED Triage Notes (Signed)
Patient complains of lower back pain radiating down right leg after awakening this am. Pain with any ambulation. States all started after doing lifting at work

## 2016-03-05 NOTE — Discharge Instructions (Signed)
SEEK IMMEDIATE MEDICAL ATTENTION IF: New numbness, tingling, weakness, or problem with the use of your arms or legs.  Severe back pain not relieved with medications.  Change in bowel or bladder control.  Increasing pain in any areas of the body (such as chest or abdominal pain).  Shortness of breath, dizziness or fainting.  Nausea (feeling sick to your stomach), vomiting, fever, or sweats.  

## 2016-03-05 NOTE — ED Notes (Signed)
Pt is in stable condition upon d/c and ambulates from ED. 

## 2016-03-05 NOTE — ED Provider Notes (Signed)
[facility]-EMERGENCY DEPT Provider Note   CSN: [id number] Arrival date & time: 03/05/16  1610  By signing my name below, I, Phillis HaggisGabriella Gaje, attest that this documentation has been prepared under the direction and in the presence of Arthor CaptainAbigail Xochitl Egle, PA-C. Electronically Signed: Phillis HaggisGabriella Gaje, ED Scribe. 03/05/16. 4:55 PM.  History   Chief Complaint Chief Complaint  Patient presents with  . Back Pain   The history is provided by the patient. No language interpreter was used.   HPI Comments: Roberto [name] is a 25 y.o. male who presents to the Emergency Department complaining of gradually worsening, waxing and waning lower back pain that radiates down the posterior right leg onset earlier this morning. Pt reports worsening pain with ambulation, movement of the bilateral legs, and sitting up straight. He says that the pain started after lifting a bus tire at work. Pt says that he fell while doing the lifting, landing on his buttocks. He injured his left foot in the fall, but this has since resolved. He took a hot shower this morning to relief, and 6 Tylenol to no relief. He denies numbness, weakness, bladder or bowel incontinence, hematuria, or dysuria.   Past Medical History:  Diagnosis Date  . H/O: pneumonia    as infant    Patient Active Problem List   Diagnosis Date Noted  . Elevated LFTs 06/12/2014  . Obesity 06/05/2014  . Depression 06/05/2014  . Obstructive sleep apnea 06/05/2014  . Esophageal reflux 06/05/2014    History reviewed. No pertinent surgical history.   Home Medications    Prior to Admission medications   Medication Sig Start Date End Date Taking? Authorizing Provider  Acetaminophen-Caffeine (EXCEDRIN TENSION HEADACHE) 500-65 MG TABS Take by mouth.    Historical Provider, MD  buPROPion (WELLBUTRIN XL) 150 MG 24 hr tablet Take 1 tablet (150 mg total) by mouth daily. 01/10/16   Donita BrooksWarren T Pickard, MD  omeprazole (PRILOSEC) 20 MG capsule TAKE 1 CAPSULE (20 MG TOTAL) BY MOUTH  DAILY. 02/05/16   Salley ScarletKawanta F [place], MD  venlafaxine XR (EFFEXOR-XR) 150 MG 24 hr capsule TAKE 1 CAPSULE (150 MG TOTAL) BY MOUTH DAILY WITH BREAKFAST. 01/14/16   Donita BrooksWarren T Pickard, MD    Family History Family History  Problem Relation Age of Onset  . Depression Mother   . Hearing loss Mother   . Hyperlipidemia Mother   . Hypertension Mother   . Miscarriages / [place] Mother   . Diabetes Father   . Hearing loss Father   . Hyperlipidemia Father   . Hypertension Father   . Alcohol abuse Brother   . Depression Brother   . Drug abuse Brother   . Depression Maternal Uncle   . Arthritis Maternal Grandmother   . Cancer Maternal Grandmother   . Depression Maternal Grandmother   . Hearing loss Maternal Grandmother   . Heart disease Maternal Grandmother   . Hypertension Maternal Grandmother   . Depression Maternal Grandfather   . Cancer Paternal Grandmother   . Heart disease Paternal Grandmother   . Alcohol abuse Paternal Grandfather   . Cancer Paternal Grandfather   . Heart disease Paternal Grandfather   . Hyperlipidemia Paternal Grandfather   . Hypertension Paternal Grandfather   . Miscarriages / Stillbirths Paternal Grandfather     retinitis pigmentosis    Social History Social History  Substance Use Topics  . Smoking status: Former Smoker    Types: E-cigarettes    Quit date: 03/31/2014  . Smokeless tobacco: Never Used  . Alcohol use  Yes    Allergies   Review of patient's allergies indicates no known allergies.   Review of Systems Review of Systems  Genitourinary: Negative for dysuria and hematuria.  Musculoskeletal: Positive for back pain.  Neurological: Negative for weakness and numbness.     Physical Exam Updated Vital Signs BP 141/79 (BP Location: Left Arm)   Pulse 109   Temp 98.3 F (36.8 C) (Oral)   Resp 18   Ht 6\' 7"  (2.007 m)   Wt (!) 395 lb (179.2 kg)   SpO2 97%   BMI 44.50 kg/m   Physical Exam  Physical Exam  Constitutional: Pt appears  well-developed and well-nourished. No distress.  HENT:  Head: Normocephalic and atraumatic.  Mouth/Throat: Oropharynx is clear and moist. No oropharyngeal exudate.  Eyes: Conjunctivae are normal.  Neck: Normal range of motion. Neck supple.  Full ROM without pain  Cardiovascular: Normal rate, regular rhythm and intact distal pulses.   Pulmonary/Chest: Effort normal and breath sounds normal. No respiratory distress. Pt has no wheezes.  Abdominal: Soft. Pt exhibits no distension. There is no tenderness.  Musculoskeletal:  Full range of motion of the T-spine and L-spine No tenderness to palpation of the spinous processes of the T-spine or L-spine Mild tenderness to palpation of the paraspinous muscles of the L-spine  Lymphadenopathy:    Pt has no cervical adenopathy.  Neurological: Pt is alert. Pt has normal reflexes.  Reflex Scores:      Bicep reflexes are 2+ on the right side and 2+ on the left side.      Brachioradialis reflexes are 2+ on the right side and 2+ on the left side.      Patellar reflexes are 2+ on the right side and 2+ on the left side.      Achilles reflexes are 2+ on the right side and 2+ on the left side. Speech is clear and goal oriented, follows commands Normal 5/5 strength in upper and lower extremities bilaterally including dorsiflexion and plantar flexion, strong and equal grip strength Sensation normal to light and sharp touch Moves extremities without ataxia, coordination intact Normal gait Normal balance No Clonus Normal DTRs Normal strength with flexion and extension Not able to bear weight on the right leg without severe shooting pain  Negative SLR bilaterally Skin: Skin is warm and dry. No rash noted. Pt is not diaphoretic. No erythema.  Psychiatric: Pt has a normal mood and affect. Behavior is normal.  Nursing note and vitals reviewed.  ED Treatments / Results  DIAGNOSTIC STUDIES: Oxygen Saturation is 97% on RA, normal by my interpretation.     COORDINATION OF CARE: 4:53 PM-Discussed treatment plan which includes muscle relaxant with pt at bedside and pt agreed to plan.    Labs (all labs ordered are listed, but only abnormal results are displayed) Labs Reviewed - No data to display  EKG  EKG Interpretation None       Radiology No results found.  Procedures Procedures (including critical care time)  Medications Ordered in ED Medications - No data to display   Initial Impression / Assessment and Plan / ED Course  I have reviewed the triage vital signs and the nursing notes.  Pertinent labs & imaging results that were available during my care of the patient were reviewed by me and considered in my medical decision making (see chart for details).  Clinical Course     Patient with back pain. +  Radicular symptoms. No weakness or abnormal reflexes. Patient can walk but  states is painful.  No loss of bowel or bladder control.  No concern for cauda equina.  No fever, night sweats, weight loss, h/o cancer, IVDU.  RICE protocol and pain medicine indicated and discussed with patient.   Final Clinical Impressions(s) / ED Diagnoses   Final diagnoses:  None  I personally performed the services described in this documentation, which was scribed in my presence. The recorded information has been reviewed and is accurate.      New Prescriptions New Prescriptions   No medications on file     Arthor Captain, PA-C 03/05/16 Ernestina Columbia    Gerhard Munch, MD 03/06/16 1727

## 2016-03-06 ENCOUNTER — Ambulatory Visit: Payer: BC Managed Care – PPO | Admitting: Physician Assistant

## 2016-03-11 ENCOUNTER — Telehealth (HOSPITAL_BASED_OUTPATIENT_CLINIC_OR_DEPARTMENT_OTHER): Payer: Self-pay | Admitting: Emergency Medicine

## 2016-03-12 ENCOUNTER — Ambulatory Visit: Payer: BC Managed Care – PPO | Admitting: Family Medicine

## 2016-03-25 ENCOUNTER — Ambulatory Visit: Payer: BC Managed Care – PPO | Admitting: Family Medicine

## 2016-04-23 ENCOUNTER — Other Ambulatory Visit: Payer: Self-pay | Admitting: Family Medicine

## 2016-06-04 ENCOUNTER — Ambulatory Visit (INDEPENDENT_AMBULATORY_CARE_PROVIDER_SITE_OTHER): Payer: BC Managed Care – PPO | Admitting: Family Medicine

## 2016-06-04 ENCOUNTER — Encounter: Payer: Self-pay | Admitting: Family Medicine

## 2016-06-04 VITALS — BP 140/100 | HR 100 | Temp 98.1°F | Resp 18 | Ht >= 80 in | Wt 396.0 lb

## 2016-06-04 DIAGNOSIS — F321 Major depressive disorder, single episode, moderate: Secondary | ICD-10-CM | POA: Diagnosis not present

## 2016-06-04 NOTE — Progress Notes (Signed)
Subjective:    Patient ID: Roberto [name], male    DOB: 07/11/1990, 26 y.o.   MRN: [id number]  HPI 07/27/14 Patient is here today for follow-up. When he last saw Roberto [name] 6 weeks ago he was started on Paxil 20 mg a day for depression. He states he is doing some better. He continues to complain of low energy, apathy, and lack of desire to do anything. Otherwise the depression is slightly better.  Patient injured his left knee in January in a snowboarding accident.  Patient has a localized area of fluid just anterior to the patella. Patient has swelling in the prepatellar bursa. There is no swelling in the knee joint itself. There is no bruising in the knee joint. There is no pain with range of motion in the knee joint. The patient denies any clicking or grinding.  Patient's acid reflux is gotten better on omeprazole.  AT that time, my plan was: He has prepatellar bursitis. I will treat that with diclofenac 75 mg by mouth twice a day. Recheck in 2-3 weeks if no better for possible cortisone injection and aspiration of the prepatellar bursa. Continue omeprazole 20 mg by mouth daily for acid reflux. Also recommended weight loss. The patient will also continue Paxil 20 mg by mouth daily for the next 2-4 weeks. If no better at that point I would switch the patient to Effexor XR to try to improve his energy and better manage his apathy.  On 4/4, the patient called and asked to switch paxil to Effexor XR 75 mg poqday with plans to increase to 150 mg poqam after two weeks.  He is here today for follow up.  09/05/14 Patient symptoms of depression have improved. His energy level has improved. Overall he is feeling much better on the Effexor. Unfortunately the frequency of his headaches have increased. He has a past medical history of migraines and he is getting more frequent also call headaches later in the day at work fortunately his sleep study was normal. There is no evidence of sleep apnea. He also continues to  complain of pain in the right first MTP joint. He denies any injury. It did not improve with diclofenac.  At that time, my plan was: Patient's depression is better. Continue Effexor XR 150 mg by mouth daily. Recheck in one month. If patient continues to have daily migraines and also continues to struggle with obesity, we can try Topamax for weight loss as well as migraine prevention. I would like the patient to get an x-ray of his right foot to evaluate for the pain he is having in the right first MTP joint  01/10/16 Patient no longer feels that the Effexor is working well. He reports worsening anhedonia, worsening apathy, low energy, worsening feelings of depression. He denies any suicidal ideation. He denies any homicidal ideation. He denies any delusions or paranoia. He denies any symptoms of mania. He does report withdrawal symptoms if he does not take his Effexor within 24 hours. He has no history of seizures.  At that time, my plan was: Continue Effexor XR 150 mg by mouth every morning. Augment with Wellbutrin XL 150 mg by mouth every morning. Recheck in one month  06/04/16 Patient continues to have mood swings. He is currently out of work due to an injury to his back. Therefore his girlfriend is having to shoulder all the financial responsibility. This is created some animosity and stress in the relationship. The slightest thing she says will set him  off. He tends to be very moody. He also continues to complain of depression and anhedonia. He denies any insomnia. He denies any flight of ideas. He denies any intrusive thoughts. He denies any impulsive behavior. Past Medical History:  Diagnosis Date  . H/O: pneumonia    as infant   No past surgical history on file. Current Outpatient Prescriptions on File Prior to Visit  Medication Sig Dispense Refill  . Acetaminophen-Caffeine (EXCEDRIN TENSION HEADACHE) 500-65 MG TABS Take by mouth.    Marland Kitchen buPROPion (WELLBUTRIN XL) 150 MG 24 hr tablet TAKE 1  TABLET BY MOUTH EVERY DAY 30 tablet 3  . naproxen (NAPROSYN) 500 MG tablet Take 1 tablet (500 mg total) by mouth 2 (two) times daily. 30 tablet 0  . omeprazole (PRILOSEC) 20 MG capsule TAKE 1 CAPSULE (20 MG TOTAL) BY MOUTH DAILY. 90 capsule 1  . oxyCODONE (ROXICODONE) 5 MG immediate release tablet Take 0.5-1 tablets (2.5-5 mg total) by mouth every 4 (four) hours as needed for severe pain or breakthrough pain. 15 tablet 0  . predniSONE (DELTASONE) 20 MG tablet 3 tabs po daily x 3 days, then 2 tabs x 3 days, then 1.5 tabs x 3 days, then 1 tab x 3 days, then 0.5 tabs x 3 days 27 tablet 0  . venlafaxine XR (EFFEXOR-XR) 150 MG 24 hr capsule TAKE 1 CAPSULE (150 MG TOTAL) BY MOUTH DAILY WITH BREAKFAST. 30 capsule 5   No current facility-administered medications on file prior to visit.    No Known Allergies Social History   Social History  . Marital status: Married    Spouse name: N/A  . Number of children: N/A  . Years of education: N/A   Occupational History  . Not on file.   Social History Main Topics  . Smoking status: Former Smoker    Types: E-cigarettes    Quit date: 03/31/2014  . Smokeless tobacco: Never Used  . Alcohol use Yes  . Drug use: No  . Sexual activity: No   Other Topics Concern  . Not on file   Social History Narrative   Works Office manager.    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      Objective:   Physical Exam  Cardiovascular: Normal rate, regular rhythm and normal heart sounds.   No murmur heard. Pulmonary/Chest: Effort normal and breath sounds normal. No respiratory distress. He has no wheezes. He has no rales.  Abdominal: Soft. Bowel sounds are normal. He exhibits no distension. There is no tenderness. There is no rebound.  Vitals reviewed.         Assessment & Plan:  Moderate single current episode of major depressive disorder (HCC)   Discontinue  Wellbutrin. Decrease Effexor to 150 mg every other day for 1 week and then every third day for 1 week then discontinue. Begin Trintellix 5 mg a day for 2 weeks and then increase to 10 mg a day. Recheck in 6 weeks

## 2016-07-09 ENCOUNTER — Telehealth: Payer: Self-pay | Admitting: Family Medicine

## 2016-07-09 NOTE — Telephone Encounter (Signed)
Pt would like for Trintellix to be sent in or printed off for him, states doctor pickard had him on samples but he would like to go ahead and get a RX for it.

## 2016-07-10 ENCOUNTER — Other Ambulatory Visit: Payer: Self-pay | Admitting: Family Medicine

## 2016-07-10 MED ORDER — VORTIOXETINE HBR 10 MG PO TABS: 10.0000 mg | ORAL_TABLET | Freq: Every day | ORAL | 5 refills | Status: DC

## 2016-07-10 NOTE — Telephone Encounter (Signed)
Med sent to pharm 

## 2016-10-16 ENCOUNTER — Encounter: Payer: Self-pay | Admitting: Family Medicine

## 2016-10-16 ENCOUNTER — Ambulatory Visit (INDEPENDENT_AMBULATORY_CARE_PROVIDER_SITE_OTHER): Payer: BC Managed Care – PPO | Admitting: Family Medicine

## 2016-10-16 VITALS — BP 156/88 | HR 94 | Temp 98.6°F | Resp 20 | Ht >= 80 in | Wt >= 6400 oz

## 2016-10-16 DIAGNOSIS — I10 Essential (primary) hypertension: Secondary | ICD-10-CM

## 2016-10-16 DIAGNOSIS — B079 Viral wart, unspecified: Secondary | ICD-10-CM | POA: Diagnosis not present

## 2016-10-16 DIAGNOSIS — Z0000 Encounter for general adult medical examination without abnormal findings: Secondary | ICD-10-CM | POA: Diagnosis not present

## 2016-10-16 MED ORDER — LISINOPRIL 20 MG PO TABS: 20.0000 mg | ORAL_TABLET | Freq: Every day | ORAL | 3 refills | Status: DC

## 2016-10-16 MED ORDER — LISINOPRIL 20 MG PO TABS
20.0000 mg | ORAL_TABLET | Freq: Every day | ORAL | 3 refills | Status: DC
Start: 2016-10-16 — End: 2016-10-16

## 2016-10-16 NOTE — Addendum Note (Signed)
Addended by: Legrand RamsWILLIS, Vian Fluegel B on: 10/16/2016 03:51 PM   Modules accepted: Orders

## 2016-10-16 NOTE — Progress Notes (Signed)
Subjective:    Patient ID: Roberto [name], male    DOB: 04/08/1991, 26 y.o.   MRN: [id number]  HPI Patient is here today for complete physical exam. He is wanting the exam to be cleared to be a foster parent. His blood pressure today is high again. This is the fourth time in office his blood pressure is been high. He does not want to try weight loss and exercise to lower his blood pressure. He would prefer to try medication. He also requests that I excise a lesion from below his left knee. It is approximately 7 mm in diameter. It is a verruca form papule. It is been frozen was before unsuccessfully. Past Medical History:  Diagnosis Date  . H/O: pneumonia    as infant   No past surgical history on file. Current Outpatient Prescriptions on File Prior to Visit  Medication Sig Dispense Refill  . Acetaminophen-Caffeine (EXCEDRIN TENSION HEADACHE) 500-65 MG TABS Take by mouth.    Marland Kitchen. omeprazole (PRILOSEC) 20 MG capsule TAKE 1 CAPSULE (20 MG TOTAL) BY MOUTH DAILY. 90 capsule 1  . vortioxetine HBr (TRINTELLIX) 10 MG TABS Take 1 tablet (10 mg total) by mouth daily. 30 tablet 5  . meloxicam (MOBIC) 15 MG tablet Take 15 mg by mouth daily. with food  1   No current facility-administered medications on file prior to visit.    No Known Allergies Social History   Social History  . Marital status: Married    Spouse name: N/A  . Number of children: N/A  . Years of education: N/A   Occupational History  . Not on file.   Social History Main Topics  . Smoking status: Former Smoker    Types: E-cigarettes    Quit date: 03/31/2014  . Smokeless tobacco: Never Used  . Alcohol use Yes  . Drug use: No  . Sexual activity: No   Other Topics Concern  . Not on file   Social History Narrative   Works Office managerDesk Job.    Works 2nd [employer]--[employer]   Family History  Problem Relation Age of Onset  . Depression Mother   . Hearing loss Mother   . Hyperlipidemia Mother     . Hypertension Mother   . Misc[place]riages / [place]   . Diabetes Father   . Hearing loss Father   . Hyperlipidemia Father   . Hypertension Father   . Alcohol abuse Brother   . Depression Brother   . Drug abuse Brother   . Depression Maternal Uncle   . Arthritis Maternal Grandmother   . Cancer Maternal Grandmother   . Depression Maternal Grandmother   . Hearing loss Maternal Grandmother   . Heart disease Maternal Grandmother   . Hypertension Maternal Grandmother   . Depression Maternal Grandfather   . Cancer Paternal Grandmother   . Heart disease Paternal Grandmother   . Alcohol abuse Paternal Grandfather   . Cancer Paternal Grandfather   . Heart disease Paternal Grandfather   . Hyperlipidemia Paternal Grandfather   . Hypertension Paternal Grandfather   . Miscarriages / Stillbirths Paternal Grandfather        retinitis pigmentosis      Review of Systems  All other systems reviewed and are negative.      Objective:   Physical Exam  Constitutional: He is oriented to person, place, and time. He appears well-developed and well-nourished. No distress.  HENT:  Head: Normocephalic and atraumatic.  Right Ear: External ear normal.  Left Ear: External ear normal.  Nose: Nose normal.  Mouth/Throat: Oropharynx is clear and moist. No oropharyngeal exudate.  Eyes: Conjunctivae and EOM are normal. Pupils are equal, round, and reactive to light. Right eye exhibits no discharge. Left eye exhibits no discharge. No scleral icterus.  Neck: Normal range of motion. Neck supple. No JVD present. No tracheal deviation present. No thyromegaly present.  Cardiovascular: Normal rate, regular rhythm, normal heart sounds and intact distal pulses.  Exam reveals no gallop and no friction rub.   No murmur heard. Pulmonary/Chest: Effort normal and breath sounds normal. No stridor. No respiratory distress. He has no wheezes. He has no rales. He exhibits no tenderness.  Abdominal: Soft. Bowel  sounds are normal. He exhibits no distension and no mass. There is no tenderness. There is no rebound and no guarding.  Musculoskeletal: He exhibits no edema, tenderness or deformity.  Lymphadenopathy:    He has no cervical adenopathy.  Neurological: He is alert and oriented to person, place, and time. He has normal reflexes. He displays normal reflexes. No cranial nerve deficit. He exhibits normal muscle tone. Coordination normal.  Skin: Skin is warm. No rash noted. He is not diaphoretic. No erythema. No pallor.  Psychiatric: He has a normal mood and affect. His behavior is normal. Judgment and thought content normal.  Vitals reviewed.         Assessment & Plan:  General medical exam  Benign essential HTN - Plan: CBC with Differential/Platelet, COMPLETE METABOLIC PANEL WITH GFR, Lipid panel  Viral warts, unspecified type  Blood pressures extremely high. Begin lisinopril 20 mg by mouth daily and recheck blood pressure in one month. Check CBC, CMP, fasting lipid panel.. Discussed therapeutic lifestyle changes to achieve weight loss including diet exercise. Also discussed gastric bypass surgery. Anesthetized the lesion below his left knee was 0.1% lidocaine. Performed a 1 cm elliptical excision around the nodule down to the subcutaneous fascia and approximated the skin edges with 1 3-0 Ethilon suture. Stitches out in one week.

## 2016-10-17 ENCOUNTER — Encounter: Payer: Self-pay | Admitting: Family Medicine

## 2016-10-17 LAB — COMPLETE METABOLIC PANEL WITH GFR
ALT: 85 U/L — ABNORMAL HIGH (ref 9–46)
AST: 104 U/L — ABNORMAL HIGH (ref 10–40)
Albumin: 4.4 g/dL (ref 3.6–5.1)
Alkaline Phosphatase: 59 U/L (ref 40–115)
BUN: 18 mg/dL (ref 7–25)
CALCIUM: 9.4 mg/dL (ref 8.6–10.3)
CO2: 22 mmol/L (ref 20–31)
Chloride: 109 mmol/L (ref 98–110)
Creat: 1.23 mg/dL (ref 0.60–1.35)
GFR, EST NON AFRICAN AMERICAN: 80 mL/min (ref >=60)
GFR, Est African American: >89 mL/min (ref >=60)
GLUCOSE: 86 mg/dL (ref 70–99)
Potassium: 4 mmol/L (ref 3.5–5.3)
Sodium: 141 mmol/L (ref 135–146)
TOTAL PROTEIN: 6.6 g/dL (ref 6.1–8.1)
Total Bilirubin: 0.7 mg/dL (ref 0.2–1.2)

## 2016-10-17 LAB — CBC WITH DIFFERENTIAL/PLATELET
BASOS ABS: 0 {cells}/uL (ref 0–200)
Basophils Relative: 0 %
EOS ABS: 284 {cells}/uL (ref 15–500)
Eosinophils Relative: 4 %
HCT: 45.4 % (ref 38.5–50.0)
HEMOGLOBIN: 15 g/dL (ref 13.0–17.0)
LYMPHS ABS: 3195 {cells}/uL (ref 850–3900)
Lymphocytes Relative: 45 %
MCH: 28.2 pg (ref 27.0–33.0)
MCHC: 33 g/dL (ref 32.0–36.0)
MCV: 85.3 fL (ref 80.0–100.0)
MPV: 9 fL (ref 7.5–12.5)
Monocytes Absolute: 497 cells/uL (ref 200–950)
Monocytes Relative: 7 %
NEUTROS ABS: 3124 {cells}/uL (ref 1500–7800)
NEUTROS PCT: 44 %
Platelets: 259 10*3/uL (ref 140–400)
RBC: 5.32 MIL/uL (ref 4.20–5.80)
RDW: 13.9 % (ref 11.0–15.0)
WBC: 7.1 10*3/uL (ref 3.8–10.8)

## 2016-10-17 LAB — LIPID PANEL
Cholesterol: 204 mg/dL — ABNORMAL HIGH (ref <200)
HDL: 34 mg/dL — ABNORMAL LOW (ref >40)
LDL Cholesterol: 148 mg/dL — ABNORMAL HIGH (ref <100)
Total CHOL/HDL Ratio: 6 Ratio — ABNORMAL HIGH (ref <5.0)
Triglycerides: 111 mg/dL (ref <150)
VLDL: 22 mg/dL (ref <30)

## 2016-10-23 ENCOUNTER — Telehealth: Payer: Self-pay | Admitting: Family Medicine

## 2016-10-23 MED ORDER — LOSARTAN POTASSIUM 100 MG PO TABS: 100.0000 mg | ORAL_TABLET | Freq: Every day | ORAL | 3 refills | Status: DC

## 2016-10-23 NOTE — Telephone Encounter (Signed)
Pt called and states that the lisinopril has given him a dry hackey cough and would like to change to something else.  Per [facility] switch to losartan 100mg  qd.  Med sent to pharm and pt aware.

## 2016-10-30 ENCOUNTER — Other Ambulatory Visit: Payer: Self-pay | Admitting: Family Medicine

## 2017-01-19 ENCOUNTER — Encounter: Payer: Self-pay | Admitting: Family Medicine

## 2017-01-20 ENCOUNTER — Other Ambulatory Visit: Payer: Self-pay | Admitting: Family Medicine

## 2017-02-10 ENCOUNTER — Encounter: Payer: Self-pay | Admitting: Family Medicine

## 2017-02-10 ENCOUNTER — Ambulatory Visit (INDEPENDENT_AMBULATORY_CARE_PROVIDER_SITE_OTHER): Payer: BC Managed Care – PPO | Admitting: Family Medicine

## 2017-02-10 VITALS — BP 138/92 | HR 97 | Temp 98.0°F | Resp 16 | Ht >= 80 in | Wt 397.0 lb

## 2017-02-10 DIAGNOSIS — R1013 Epigastric pain: Secondary | ICD-10-CM

## 2017-02-10 MED ORDER — PANTOPRAZOLE SODIUM 40 MG PO TBEC: 40.0000 mg | DELAYED_RELEASE_TABLET | Freq: Every day | ORAL | 3 refills | Status: DC

## 2017-02-10 NOTE — Progress Notes (Signed)
   Subjective:    Patient ID: Roberto [name], male    DOB: 06-27-1990, 26 y.o.   MRN: [id number]  HPI Sunday, drank bottle of wine and ate pizza and within 1 hour developed severe subxiphoid to RUQ abdominal pain that lasted several hours.  Resolved after he took a pain killer.  Still sore in epigastirc area and ruq but much better.  Exacerbated by food.  Denies nsaid use.  Denies alcohol abuse.  Using omeprazole 20 mg sporadically.   Past Medical History:  Diagnosis Date  . Benign essential HTN   . Depression   . Fatty liver disease, nonalcoholic   . H/O: pneumonia    as infant  . HLD (hyperlipidemia)    No past surgical history on file. Current Outpatient Prescriptions on File Prior to Visit  Medication Sig Dispense Refill  . Acetaminophen-Caffeine (EXCEDRIN TENSION HEADACHE) 500-65 MG TABS Take by mouth.    . losartan (COZAAR) 100 MG tablet Take 1 tablet (100 mg total) by mouth daily. 90 tablet 3  . omeprazole (PRILOSEC) 20 MG capsule TAKE ONE CAPSULE BY MOUTH EVERY DAY 90 capsule 3  . TRINTELLIX 10 MG TABS TAKE 1 TABLET BY MOUTH EVERY DAY 30 tablet 5   No current facility-administered medications on file prior to visit.    No Known Allergies Social History   Social History  . Marital status: Married    Spouse name: N/A  . Number of children: N/A  . Years of education: N/A   Occupational History  . Not on file.   Social History Main Topics  . Smoking status: Former Smoker    Types: E-cigarettes    Quit date: 03/31/2014  . Smokeless tobacco: Never Used  . Alcohol use Yes  . Drug use: No  . Sexual activity: No   Other Topics Concern  . Not on file   Social History Narrative   Works Office manager.    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      Objective:   Physical Exam  Cardiovascular: Normal rate, regular rhythm and normal heart sounds.   No murmur  heard. Pulmonary/Chest: Effort normal and breath sounds normal. No respiratory distress. He has no wheezes. He has no rales.  Abdominal: Soft. Bowel sounds are normal. He exhibits no distension. There is no tenderness. There is no rebound.  Vitals reviewed.         Assessment & Plan:  Epigastric pain - Plan: pantoprazole (PROTONIX) 40 MG tablet, Amylase, Lipase, CBC with Differential/Platelet, COMPLETE METABOLIC PANEL WITH GFR   Obtain urgent ultrasound of the right upper quadrant to evaluate for gallstones. We'll try to expedite this as soon as possible. Patient is still tender to palpation in the right upper quadrant although there is no evidence of fever jaundice.  Check CBC, CMP to evaluate for elevated liver function tests, elevated bilirubin, elevated white blood cell count that would suggest a more emergent situation. Check lipase to rule out pancreatitis even the association with alcohol. Discontinue omeprazole and replaced with protonix 40 mg a day.  DDX: cholelithiasis, gastritis, pancreatitis.

## 2017-02-11 LAB — CBC WITH DIFFERENTIAL/PLATELET
Basophils Absolute: 41 cells/uL (ref 0–200)
Basophils Relative: 0.8 %
EOS ABS: 138 {cells}/uL (ref 15–500)
EOS PCT: 2.7 %
HEMATOCRIT: 44.1 % (ref 38.5–50.0)
Hemoglobin: 15 g/dL (ref 13.2–17.1)
Lymphs Abs: 2213 cells/uL (ref 850–3900)
MCH: 28.2 pg (ref 27.0–33.0)
MCHC: 34 g/dL (ref 32.0–36.0)
MCV: 83.1 fL (ref 80.0–100.0)
MPV: 10.1 fL (ref 7.5–12.5)
Monocytes Relative: 9 %
NEUTROS ABS: 2249 {cells}/uL (ref 1500–7800)
Neutrophils Relative %: 44.1 %
Platelets: 256 10*3/uL (ref 140–400)
RBC: 5.31 10*6/uL (ref 4.20–5.80)
RDW: 13 % (ref 11.0–15.0)
Total Lymphocyte: 43.4 %
WBC: 5.1 10*3/uL (ref 3.8–10.8)
WBCMIX: 459 {cells}/uL (ref 200–950)

## 2017-02-11 LAB — COMPLETE METABOLIC PANEL WITH GFR
AG Ratio: 2.1 (calc) (ref 1.0–2.5)
ALT: 50 U/L — ABNORMAL HIGH (ref 9–46)
AST: 40 U/L (ref 10–40)
Albumin: 4.5 g/dL (ref 3.6–5.1)
Alkaline phosphatase (APISO): 58 U/L (ref 40–115)
BILIRUBIN TOTAL: 0.5 mg/dL (ref 0.2–1.2)
BUN: 15 mg/dL (ref 7–25)
CHLORIDE: 106 mmol/L (ref 98–110)
CO2: 27 mmol/L (ref 20–32)
Calcium: 9.3 mg/dL (ref 8.6–10.3)
Creat: 1.06 mg/dL (ref 0.60–1.35)
GFR, EST NON AFRICAN AMERICAN: 95 mL/min/{1.73_m2} (ref >=60)
GFR, Est African American: 110 mL/min/{1.73_m2} (ref >=60)
Globulin: 2.1 g/dL (calc) (ref 1.9–3.7)
Glucose, Bld: 93 mg/dL (ref 65–99)
POTASSIUM: 4.6 mmol/L (ref 3.5–5.3)
Sodium: 139 mmol/L (ref 135–146)
Total Protein: 6.6 g/dL (ref 6.1–8.1)

## 2017-02-11 LAB — LIPASE: Lipase: 15 U/L (ref 7–60)

## 2017-02-11 LAB — AMYLASE: Amylase: 20 U/L — ABNORMAL LOW (ref 21–101)

## 2017-02-12 ENCOUNTER — Other Ambulatory Visit: Payer: Self-pay

## 2017-02-12 ENCOUNTER — Ambulatory Visit
Admission: RE | Admit: 2017-02-12 | Discharge: 2017-02-12 | Disposition: A | Discharge status: Home / Self Care | Payer: BC Managed Care – PPO | Source: Ambulatory Visit | Attending: Family Medicine | Admitting: Family Medicine

## 2017-02-12 DIAGNOSIS — R1013 Epigastric pain: Secondary | ICD-10-CM

## 2017-08-16 ENCOUNTER — Other Ambulatory Visit: Payer: Self-pay | Admitting: Family Medicine

## 2017-11-10 ENCOUNTER — Other Ambulatory Visit: Payer: Self-pay | Admitting: Family Medicine

## 2017-11-17 ENCOUNTER — Encounter (HOSPITAL_COMMUNITY): Payer: Self-pay | Admitting: Emergency Medicine

## 2017-11-17 ENCOUNTER — Ambulatory Visit (HOSPITAL_COMMUNITY)
Admission: EM | Admit: 2017-11-17 | Discharge: 2017-11-17 | Disposition: A | Discharge status: Home / Self Care | Payer: BC Managed Care – PPO | Attending: Family Medicine | Admitting: Family Medicine

## 2017-11-17 DIAGNOSIS — M549 Dorsalgia, unspecified: Secondary | ICD-10-CM | POA: Diagnosis not present

## 2017-11-17 NOTE — ED Triage Notes (Signed)
PT reports upper back pain with nausea, radiation to left arm with left arm numbness. Started at [facility]30

## 2017-11-17 NOTE — ED Provider Notes (Signed)
[facility]    CSN: [id number] Arrival date & time: 11/17/17  2956     History   Chief Complaint Chief Complaint  Patient presents with  . Back Pain  . Nausea    HPI Roberto [name] is a 27 y.o. male.   27 year old male with history of HTN, fatty liver disease, HLD, comes in for left upper back pain, nausea, numbness tingling to the fingers.  States he was driving to work this morning when he first started noticing some mid upper back pain that spread to the left, around the scapula.  States it feels like a bruise, sore in nature, constant, worse with taking deep breaths.  States when he takes deep breath, the pain can radiate to the elbow.  No obvious injury/trauma.  States had some numbness and tingling to the fingers that has now resolved.  He denies chest pain, shortness of breath, palpitation.  Denies weakness, dizziness, syncope, lightheadedness.  States he continued to work, and when he got hot, started having nausea that has slightly improved since symptom onset.  Denies abdominal pain, vomiting.  Denies diarrhea.  Did have slight constipation yesterday.  Denies URI symptoms such as cough, congestion, sore throat.  Denies fever, chills, night sweats.  Denies anginal symptoms.  Denies personal or family history of heart disease.  Work requires heavy lifting, and recently has increase in activity due to newly foster children. States he did not do strenuous activity this morning.     Past Medical History:  Diagnosis Date  . Benign essential HTN   . Depression   . Fatty liver disease, nonalcoholic   . H/O: pneumonia    as infant  . HLD (hyperlipidemia)     Patient Active Problem List   Diagnosis Date Noted  . Elevated LFTs 06/12/2014  . Obesity 06/05/2014  . Depression 06/05/2014  . Obstructive sleep apnea 06/05/2014  . Esophageal reflux 06/05/2014    History reviewed. No pertinent surgical history.     Home Medications    Prior to Admission medications    Medication Sig Start Date End Date Taking? Authorizing Provider  losartan (COZAAR) 100 MG tablet TAKE 1 TABLET BY MOUTH EVERY DAY 11/10/17  Yes Donita Brooks, MD  Multiple Vitamin (MULTIVITAMIN WITH MINERALS) TABS tablet Take 1 tablet by mouth daily.   Yes [provider]  omeprazole (PRILOSEC) 20 MG capsule TAKE ONE CAPSULE BY MOUTH EVERY DAY 10/30/16  Yes Donita Brooks, MD  TRINTELLIX 10 MG TABS tablet TAKE 1 TABLET BY MOUTH EVERY DAY 08/17/17  Yes Donita Brooks, MD  Acetaminophen-Caffeine [facility] TENSION HEADACHE) 500-65 MG TABS Take by mouth.    [provider]    Family History Family History  Problem Relation Age of Onset  . Depression Mother   . Hearing loss Mother   . Hyperlipidemia Mother   . Hypertension Mother   . Miscarriages / [place] Mother   . Diabetes Father   . Hearing loss Father   . Hyperlipidemia Father   . Hypertension Father   . Alcohol abuse Brother   . Depression Brother   . Drug abuse Brother   . Depression Maternal Uncle   . Arthritis Maternal Grandmother   . Cancer Maternal Grandmother   . Depression Maternal Grandmother   . Hearing loss Maternal Grandmother   . Heart disease Maternal Grandmother   . Hypertension Maternal Grandmother   . Depression Maternal Grandfather   . Cancer Paternal Grandmother   . Heart disease Paternal  Grandmother   . Alcohol abuse Paternal Grandfather   . Cancer Paternal Grandfather   . Heart disease Paternal Grandfather   . Hyperlipidemia Paternal Grandfather   . Hypertension Paternal Grandfather   . Miscarriages / Stillbirths Paternal Grandfather        retinitis pigmentosis    Social History Social History   Tobacco Use  . Smoking status: Former Smoker    Types: E-cigarettes    Last attempt to quit: 03/31/2014    Years since quitting: 3.6  . Smokeless tobacco: Never Used  Substance Use Topics  . Alcohol use: Yes  . Drug use: No     Allergies   Patient has no known  allergies.   Review of Systems Review of Systems  Reason unable to perform ROS: See HPI as above.     Physical Exam Triage Vital Signs ED Triage Vitals  Enc Vitals Group     BP 11/17/17 0826 115/77     Pulse Rate 11/17/17 0826 78     Resp 11/17/17 0826 16     Temp 11/17/17 0826 98.1 F (36.7 C)     Temp Source 11/17/17 0826 Oral     SpO2 11/17/17 0826 97 %     Weight --      Height --      Head Circumference --      Peak Flow --      Pain Score 11/17/17 0827 5     Pain Loc --      Pain Edu? --      Excl. in GC? --    No data found.  Updated Vital Signs BP 115/77 (BP Location: Right Arm)   Pulse 78   Temp 98.1 F (36.7 C) (Oral)   Resp 16   SpO2 97%   Physical Exam  Constitutional: He is oriented to person, place, and time. He appears well-developed and well-nourished. No distress.  HENT:  Head: Normocephalic and atraumatic.  Eyes: Pupils are equal, round, and reactive to light. Conjunctivae are normal.  Cardiovascular: Normal rate, regular rhythm and normal heart sounds. Exam reveals no gallop and no friction rub.  No murmur heard. Pulmonary/Chest: Effort normal and breath sounds normal. No accessory muscle usage or stridor. No respiratory distress. He has no decreased breath sounds. He has no wheezes. He has no rhonchi. He has no rales.  Abdominal: Soft. Bowel sounds are normal. There is no tenderness. There is no rebound and no guarding.  Musculoskeletal:  No tenderness on palpation of the spinous processes. No obvious tenderness to palpation of the back, though location of pain is left thoracic, directly inferior to the scapula.  Range of motion of the back can exacerbate the pain.  Certain range of motion of the shoulder relieves the pain.  Full range of motion of back, shoulder, elbow, fingers.  Strength normal and equal bilaterally.  Sensation intact and equal bilaterally.  Radial pulses 2+ and equal bilaterally. Capillary refill less than 2 seconds.     Neurological: He is alert and oriented to person, place, and time.  Skin: Skin is warm and dry. He is not diaphoretic.     UC Treatments / Results  Labs (all labs ordered are listed, but only abnormal results are displayed) Labs Reviewed - No data to display  EKG None  Radiology No results found.  Procedures Procedures (including critical care time)  Medications Ordered in UC Medications - No data to display  Initial Impression / Assessment and Plan / UC Course  I have reviewed the triage vital signs and the nursing notes.  Pertinent labs & imaging results that were available during my care of the patient were reviewed by me and considered in my medical decision making (see chart for details).    No alarming signs on exam.  EKG normal sinus rhythm, 80 bpm, no acute ST changes.  Patient nontoxic in appearance, afebrile without tachycardia, tachypnea, sitting comfortably on exam table.  Abdomen nontender to palpation.  Discussed with patient back and arm pain more consistent with muscular pain, will have patient start NSAIDs.  No obvious etiology of nausea, however, patient stated improved symptoms.  Will have patient monitor nausea for now.  Push fluids.  Return precautions given.  Patient expresses understanding and agrees to plan.  Final Clinical Impressions(s) / UC Diagnoses   Final diagnoses:  Upper back pain on left side    ED Prescriptions    None        Belinda FisherYu, Wilbert Hayashi V, PA-C 11/17/17 1232

## 2017-11-17 NOTE — Discharge Instructions (Signed)
No alarming sign on exam.  Your EKG was normal.  As discussed, left back/arm pain most likely due to inflammation.  Please take your ibuprofen 800 mg 3 times a day for the next 7 to 10 days.  Warm compress to the back can also help with symptoms.  As discussed, nausea may or may not be due to current symptoms.  Please continue to monitor, if experiencing worsening nausea/vomiting, abdominal pain, follow-up for reevaluation.  If experienced chest pain, shortness of breath, weakness, dizziness, feeling like passing out, go to the emergency department for further evaluation.

## 2017-12-21 ENCOUNTER — Other Ambulatory Visit: Payer: Self-pay | Admitting: Family Medicine

## 2018-02-19 ENCOUNTER — Other Ambulatory Visit: Payer: Self-pay | Admitting: Family Medicine

## 2018-04-04 ENCOUNTER — Emergency Department (HOSPITAL_COMMUNITY)
Admission: EM | Admit: 2018-04-04 | Discharge: 2018-04-04 | Disposition: A | Discharge status: Home / Self Care | Payer: BC Managed Care – PPO | Attending: Emergency Medicine | Admitting: Emergency Medicine

## 2018-04-04 ENCOUNTER — Emergency Department (HOSPITAL_COMMUNITY): Payer: BC Managed Care – PPO

## 2018-04-04 ENCOUNTER — Encounter (HOSPITAL_COMMUNITY): Payer: Self-pay | Admitting: Emergency Medicine

## 2018-04-04 DIAGNOSIS — M5441 Lumbago with sciatica, right side: Secondary | ICD-10-CM | POA: Diagnosis not present

## 2018-04-04 DIAGNOSIS — M5442 Lumbago with sciatica, left side: Secondary | ICD-10-CM | POA: Insufficient documentation

## 2018-04-04 DIAGNOSIS — Z87891 Personal history of nicotine dependence: Secondary | ICD-10-CM | POA: Diagnosis not present

## 2018-04-04 DIAGNOSIS — M545 Low back pain: Secondary | ICD-10-CM | POA: Diagnosis present

## 2018-04-04 DIAGNOSIS — I10 Essential (primary) hypertension: Secondary | ICD-10-CM | POA: Insufficient documentation

## 2018-04-04 DIAGNOSIS — Z79899 Other long term (current) drug therapy: Secondary | ICD-10-CM | POA: Diagnosis not present

## 2018-04-04 MED ORDER — METHOCARBAMOL 500 MG PO TABS: 500.0000 mg | ORAL_TABLET | Freq: Two times a day (BID) | ORAL | 0 refills | Status: DC

## 2018-04-04 MED ORDER — PREDNISONE 10 MG (21) PO TBPK: ORAL_TABLET | Freq: Every day | ORAL | 0 refills | Status: DC

## 2018-04-04 MED ORDER — OXYCODONE-ACETAMINOPHEN 5-325 MG PO TABS
1.0000 | ORAL_TABLET | Freq: Once | ORAL | Status: AC
  Administered 2018-04-04: 1 via ORAL
  Filled 2018-04-04: qty 1

## 2018-04-04 NOTE — ED Triage Notes (Signed)
Patient reports he has some chronic lower back pain that worsened yesterday after taking out the trash. Also reports bilateral leg numbness. Pt reports he has taken 800mg  ibuprofen and oxycodone without relief. Denies any loss of bowel or bladder.

## 2018-04-04 NOTE — ED Notes (Signed)
Pt stable, ambulatory, states understanding of discharge instructions 

## 2018-04-04 NOTE — ED Provider Notes (Signed)
[facility] Provider Note   CSN: [id number] Arrival date & time: 04/04/18  [id number]   History   Chief Complaint Chief Complaint  Patient presents with  . Back Pain    HPI Roberto [name] is a 27 y.o. male presenting with intermittent low back pain onset 2 years ago after a work related injury. Patient reports pain has worsened after picking up the trash yesterday. Patient describes back pain as dull and states pain intermittently radiates to legs bilaterally. Patient reports numbness in legs bilaterally intermittently. Patient reports leg pain as a shooting pain. Patient reports diffuse weakness. Patient reports pain is worse with walking and standing. Patient denies fever, chills, night sweats, history of cancer, history of IVDU, or bowel/bladder incontinence/retention. Patient reports he has tried ibuprofen and oxycodone without relief. Patient denies abdominal pain, nausea, or vomiting. Last MRI was in 2018. Patient reports he has had 7 radiofrequency ablations and last one was in 01/2018. Patient reports this pain is the same as the pain he has had in the past. Patient states he has an appointment on Friday with [facility].   HPI  Past Medical History:  Diagnosis Date  . Benign essential HTN   . Depression   . Fatty liver disease, nonalcoholic   . H/O: pneumonia    as infant  . HLD (hyperlipidemia)     Patient Active Problem List   Diagnosis Date Noted  . Elevated LFTs 06/12/2014  . Obesity 06/05/2014  . Depression 06/05/2014  . Obstructive sleep apnea 06/05/2014  . Esophageal reflux 06/05/2014    History reviewed. No pertinent surgical history.      Home Medications    Prior to Admission medications   Medication Sig Start Date End Date Taking? Authorizing Provider  Acetaminophen-Caffeine (EXCEDRIN TENSION HEADACHE) 500-65 MG TABS Take by mouth.    [provider]  losartan (COZAAR) 100 MG tablet TAKE 1 TABLET BY  MOUTH EVERY DAY 11/10/17   Donita BrooksPickard, Warren T, MD  methocarbamol (ROBAXIN) 500 MG tablet Take 1 tablet (500 mg total) by mouth 2 (two) times daily. 04/04/18   Carlyle BasquesHernandez, Britley Gashi P, PA-C  Multiple Vitamin (MULTIVITAMIN WITH MINERALS) TABS tablet Take 1 tablet by mouth daily.    [provider]  omeprazole (PRILOSEC) 20 MG capsule TAKE 1 CAPSULE BY MOUTH EVERY DAY 12/21/17   Donita BrooksPickard, Warren T, MD  predniSONE (STERAPRED UNI-PAK 21 TAB) 10 MG (21) TBPK tablet Take by mouth daily. Take 6 tabs by mouth daily for 2 days, then 5 tabs for 2 days, then 4 tabs for 2 days, then 3 tabs for 2 days, 2 tabs for 2 days, then 1 tab by mouth daily for 2 days 04/04/18   Carlyle BasquesHernandez, Marsh Heckler P, PA-C  TRINTELLIX 10 MG TABS tablet TAKE 1 TABLET BY MOUTH EVERY DAY 02/19/18   Donita BrooksPickard, Warren T, MD    Family History Family History  Problem Relation Age of Onset  . Depression Mother   . Hearing loss Mother   . Hyperlipidemia Mother   . Hypertension Mother   . Miscarriages / [place] Mother   . Diabetes Father   . Hearing loss Father   . Hyperlipidemia Father   . Hypertension Father   . Alcohol abuse Brother   . Depression Brother   . Drug abuse Brother   . Depression Maternal Uncle   . Arthritis Maternal Grandmother   . Cancer Maternal Grandmother   . Depression Maternal Grandmother   . Hearing loss Maternal Grandmother   .  Heart disease Maternal Grandmother   . Hypertension Maternal Grandmother   . Depression Maternal Grandfather   . Cancer Paternal Grandmother   . Heart disease Paternal Grandmother   . Alcohol abuse Paternal Grandfather   . Cancer Paternal Grandfather   . Heart disease Paternal Grandfather   . Hyperlipidemia Paternal Grandfather   . Hypertension Paternal Grandfather   . Miscarriages / Stillbirths Paternal Grandfather        retinitis pigmentosis    Social History Social History   Tobacco Use  . Smoking status: Former Smoker    Types: E-cigarettes    Last attempt to quit:  03/31/2014    Years since quitting: 4.0  . Smokeless tobacco: Never Used  Substance Use Topics  . Alcohol use: Yes  . Drug use: No     Allergies   Patient has no known allergies.   Review of Systems Review of Systems  Constitutional: Negative for chills, diaphoresis and fever.  Respiratory: Negative for cough and shortness of breath.   Cardiovascular: Negative for chest pain.  Gastrointestinal: Negative for abdominal pain, nausea and vomiting.  Endocrine: Negative for cold intolerance and heat intolerance.  Genitourinary: Negative for difficulty urinating and dysuria.  Musculoskeletal: Positive for back pain and gait problem.  Skin: Negative for rash.  Allergic/Immunologic: Negative for immunocompromised state.  Neurological: Positive for weakness and numbness. Negative for dizziness and headaches.  Hematological: Negative for adenopathy.  Psychiatric/Behavioral: The patient is not nervous/anxious.      Physical Exam Updated Vital Signs BP 129/80 (BP Location: Right Arm)   Pulse 77   Temp 97.8 F (36.6 C) (Oral)   Resp 17   SpO2 98%   Physical Exam  Constitutional: He is oriented to person, place, and time. He appears well-developed and well-nourished. No distress.  HENT:  Head: Normocephalic and atraumatic.  Neck: Normal range of motion. Neck supple.  Cardiovascular: Normal rate, regular rhythm and normal heart sounds. Exam reveals no gallop and no friction rub.  No murmur heard. Pulmonary/Chest: Effort normal and breath sounds normal. No respiratory distress. He has no wheezes. He has no rales.  Abdominal: Soft. He exhibits no distension. There is no tenderness. There is no guarding.  Musculoskeletal:       Cervical back: Normal. He exhibits normal range of motion, no tenderness, no bony tenderness and no swelling.       Thoracic back: Normal. He exhibits normal range of motion, no tenderness, no bony tenderness and no swelling.       Lumbar back: He exhibits  decreased range of motion, tenderness and bony tenderness. He exhibits no swelling, no edema and no deformity.  No signs of erythema or edema noted on exam. Paraspinal and midline tenderness on low back. Limited ROM with flexion and extension of back. No midline or paraspinal tenderness on thoracic or cervical spine. Sensation is intact in bilateral lower extremities. 5/5 strength with dorsiflexion and plantar flexion in lower extremities. DP pulses 2+ bilateral lower extremities. Patient ambulates with an antalgic gate, but he is able to walk with feet plantar flexed and dorsiflexed.   Neurological: He is alert and oriented to person, place, and time.  Skin: Skin is warm. No rash noted. He is not diaphoretic. No erythema.  Psychiatric: He has a normal mood and affect.  Nursing note and vitals reviewed.    ED Treatments / Results  Labs (all labs ordered are listed, but only abnormal results are displayed) Labs Reviewed - No data to display  EKG  None  Radiology Dg Thoracic Spine 2 View  Result Date: 04/04/2018 CLINICAL DATA:  Worsening back pain. EXAM: THORACIC SPINE 2 VIEWS COMPARISON:  None. FINDINGS: There is no evidence of thoracic spine fracture. Alignment is normal. No other significant bone abnormalities are identified. IMPRESSION: Negative. Electronically Signed   By: Ted Mcalpine M.D.   On: 04/04/2018 12:10   Dg Lumbar Spine Complete  Result Date: 04/04/2018 CLINICAL DATA:  Chronic low back pain. EXAM: LUMBAR SPINE - COMPLETE 4+ VIEW COMPARISON:  None. FINDINGS: There is no evidence of lumbar spine fracture. Alignment is normal. Intervertebral disc spaces are maintained. Mild L5-S1 posterior facet arthropathy. IMPRESSION: Mild L5-S1 posterior facet arthropathy. Electronically Signed   By: Ted Mcalpine M.D.   On: 04/04/2018 12:06    Procedures Procedures (including critical care time)  Medications Ordered in ED Medications  oxyCODONE-acetaminophen  (PERCOCET/ROXICET) 5-325 MG per tablet 1 tablet (1 tablet Oral Given 04/04/18 1131)     Initial Impression / Assessment and Plan / ED Course  I have reviewed the triage vital signs and the nursing notes.  Pertinent labs & imaging results that were available during my care of the patient were reviewed by me and considered in my medical decision making (see chart for details).  Clinical Course as of Apr 05 1307  Sun Apr 04, 2018  1242 Thoracic spine x ray is negative and Lumbar spine x ray reveals mild L5-S1 posterior facet arthropathy.  DG Thoracic Spine 2 View [AH]    Clinical Course User Index [AH] Leretha Dykes, PA-C    Patient with back pain.  No neurological deficits and normal neuro exam.  Patient can walk but states is painful.  No loss of bowel or bladder control.  No concern for cauda equina.  No fever, night sweats, weight loss, h/o cancer, IVDU.  RICE protocol, provided robaxin and steroid taper for symptoms. Discussed medications with patient and encouraged patient to follow up with Orthopedics/PCP in 3 days.    Final Clinical Impressions(s) / ED Diagnoses   Final diagnoses:  Acute bilateral low back pain with bilateral sciatica    ED Discharge Orders         Ordered    predniSONE (STERAPRED UNI-PAK 21 TAB) 10 MG (21) TBPK tablet  Daily     04/04/18 1252    methocarbamol (ROBAXIN) 500 MG tablet  2 times daily     04/04/18 1252           Leretha Dykes, [place] 04/04/18 1308    Gwyneth Sprout, MD 04/04/18 2113

## 2018-04-04 NOTE — Discharge Instructions (Signed)
You have been seen today for low back pain. Please read and follow all provided instructions.   1. Medications: steroid taper for symptoms, and robaxin for muscle relaxant, usual home medications. Do not drive or operate heavy machinery while taking robaxin.  2. Treatment: rest, drink plenty of fluids 3. Follow Up: Please follow up with your primary doctor in 3 days for discussion of your diagnoses and further evaluation after today's visit; if you do not have a primary care doctor use the resource guide provided to find one; Please return to the ER for any new or worsening symptoms.   Take medications as prescribed. Return to the emergency room for worsening condition or new concerning symptoms. Follow up with your regular doctor. If you don't have a regular doctor use one of the numbers below to establish a primary care doctor.   Emergency Department Resource Guide 1) Find a Doctor and Pay Out of Pocket Although you won't have to find out who is covered by your insurance plan, it is a good idea to ask around and get recommendations. You will then need to call the office and see if the doctor you have chosen will accept you as a new patient and what types of options they offer for patients who are self-pay. Some doctors offer discounts or will set up payment plans for their patients who do not have insurance, but you will need to ask so you aren't surprised when you get to your appointment.  2) Contact Your Local Health Department Not all health departments have doctors that can see patients for sick visits, but many do, so it is worth a call to see if yours does. If you don't know where your local health department is, you can check in your phone book. The CDC also has a tool to help you locate your state's health department, and many state websites also have listings of all of their local health departments.  3) Find a Walk-in Clinic If your illness is not likely to be very severe or complicated,  you may want to try a walk in clinic. These are popping up all over the country in pharmacies, drugstores, and shopping centers. They're usually staffed by nurse practitioners or physician assistants that have been trained to treat common illnesses and complaints. They're usually fairly quick and inexpensive. However, if you have serious medical issues or chronic medical problems, these are probably not your best option.  No Primary Care Doctor: Call Health Connect at  [phone] - they can help you locate a primary care doctor that  accepts your insurance, provides certain services, etc. Physician Referral [phone]- [phone]  Emergency Department Resource Guide 1) Find a Doctor and Pay Out of Pocket Although you won't have to find out who is covered by your insurance plan, it is a good idea to ask around and get recommendations. You will then need to call the office and see if the doctor you have chosen will accept you as a new patient and what types of options they offer for patients who are self-pay. Some doctors offer discounts or will set up payment plans for their patients who do not have insurance, but you will need to ask so you aren't surprised when you get to your appointment.  2) Contact Your Local Health Department Not all health departments have doctors that can see patients for sick visits, but many do, so it is worth a call to see if yours does. If you don't know where your  local health department is, you can check in your phone book. The CDC also has a tool to help you locate your state's health department, and many state websites also have listings of all of their local health departments.  3) Find a Walk-in Clinic If your illness is not likely to be very severe or complicated, you may want to try a walk in clinic. These are popping up all over the country in pharmacies, drugstores, and shopping centers. They're usually staffed by nurse practitioners or physician assistants that  have been trained to treat common illnesses and complaints. They're usually fairly quick and inexpensive. However, if you have serious medical issues or chronic medical problems, these are probably not your best option.  No Primary Care Doctor: Call Health Connect at  [phone] - they can help you locate a primary care doctor that  accepts your insurance, provides certain services, etc. Physician Referral Service- [phone]  Chronic Pain Problems: Organization         Address  Phone   Notes  [facility]  [phone] Patients need to be referred by their primary care doctor.   Medication Assistance: Organization         Address  Phone   Notes  [facility] Medication [facility] [address] [place] [place], [place] [address] [phone] --Must be a resident of [facility] -- Must have NO insurance coverage whatsoever (no Medicaid/ Medicare, etc.) -- The pt. MUST have a primary care doctor that directs their care regularly and follows them in the community   [facility]  [phone]   [employer]  [phone]    Agencies that provide inexpensive medical care: Organization         Address  Phone   Notes  [facility]  [phone]   [facility]    [phone]   [facility] [address] [place], [place] [address] [phone]   [facility] [address] [place] [place] [phone]   [facility]    [phone]   [facility]    [phone]   [facility]  [address], [place] Phone:  [phone], Fax:  [phone] Hours of Operation:  9 am - 6 pm, M-F.  Also accepts Medicaid/Medicare and self-pay.  [facility]  [address], [place] Phone: [phone], Fax: [phone]. Hours of Operation:  8:30 am - 5:30 pm, M-F.  Also accepts Medicaid and  self-pay.  [facility] [place] [address], [place] Phone: [phone]   [facility] [address] Natasha Bence [place], [place] [phone], Ext. [phone] Mondays & Thursdays: 7-9 AM.  First 15 patients are seen on a first come, first serve basis.    Medicaid-accepting [facility] Providers:  Organization         Address  Phone   Notes  [facility] [address], [place] [phone] Also accepts self-pay patients.  [facility] [address] Laurell Josephs [place], [place]  [phone]   [facility] [address], [place] [phone]   [facility] [address], [place] [phone]   Renaye Rakers [address], [place]   [phone] Only accepts [place] patients after they have  their name applied to their card.   Self-Pay (no insurance) in [facility]:  Organization         Address  Phone   Notes  Sickle Cell Patients, [facility] [address] [place], [place] [phone]   [facility] [address] [place], [place] [phone]   [facility] [place]  [address], [place] [phone]   [facility]/Dr. Osei-Bonsu  [address], [place] or [address], [place] [phone] Phone number for both [place] and [place] locations is the same.  [facility] [address] [place] [phone]   [facility] [address], [place] or [address] [phone]   [facility] [address] [place] [phone], phone; [phone], fax Sees patients 1st and 3rd Saturday of every month.  Must not qualify for public or private insurance (i.e. Medicaid, Medicare, [place] Health Choice, Veterans' Benefits)  Household income should  be no more than 200% of the poverty level The clinic cannot treat you if you are pregnant or think you are pregnant  Sexually transmitted diseases are not treated at the clinic.

## 2018-04-04 NOTE — ED Notes (Signed)
Patient transported to X-ray 

## 2018-04-04 NOTE — ED Notes (Signed)
Pt not in room.

## 2018-05-17 ENCOUNTER — Other Ambulatory Visit: Payer: Self-pay | Admitting: Family Medicine

## 2018-05-27 ENCOUNTER — Encounter: Payer: Self-pay | Admitting: Family Medicine

## 2018-05-27 ENCOUNTER — Ambulatory Visit: Payer: BC Managed Care – PPO | Admitting: Family Medicine

## 2018-05-27 VITALS — BP 110/70 | HR 96 | Temp 97.7°F | Resp 15 | Ht >= 80 in | Wt 394.2 lb

## 2018-05-27 DIAGNOSIS — K529 Noninfective gastroenteritis and colitis, unspecified: Secondary | ICD-10-CM

## 2018-05-27 NOTE — Progress Notes (Signed)
Patient ID: Roberto MoynahanDavid [name], male    DOB: 09/19/1990, 28 y.o.   MRN: [id number]  PCP: Roberto [name]  Chief Complaint  Patient presents with  . Fever    Patient in with c/o fever, diarrhea, abdominal pain    Subjective:   Roberto [name] is a 28 y.o. male, presents to clinic with CC of nausea, diarrhea, abdominal pain and fever, sick like wife. Diarrhea   This is a new problem. The current episode started yesterday. The problem occurs 2 to 4 times per day. The problem has been gradually improving. The stool consistency is described as watery. The patient states that diarrhea does not awaken him from sleep. Associated symptoms include abdominal pain, a fever and sweats. Pertinent negatives include no arthralgias, bloating, chills, coughing, headaches, increased  flatus, myalgias, URI, vomiting or weight loss. Risk factors include ill contacts. He has tried nothing for the symptoms. There is no history of a recent abdominal surgery.      Patient Active Problem List   Diagnosis Date Noted  . Elevated LFTs 06/12/2014  . Obesity 06/05/2014  . Depression 06/05/2014  . Obstructive sleep apnea 06/05/2014  . Esophageal reflux 06/05/2014     Prior to Admission medications   Medication Sig Start Date End Date Taking? Authorizing Provider  Acetaminophen-Caffeine (EXCEDRIN TENSION HEADACHE) 500-65 MG TABS Take by mouth.    Provider, Historical, [name]  cyclobenzaprine (FLEXERIL) 5 MG tablet TAKE 1 OR 2 TABLETS BY MOUTH AT BEDTIME AS NEEDED FOR MUSCLE SPASM/TIGHTNESS 05/06/18   Provider, Historical, [name]  losartan (COZAAR) 100 MG tablet TAKE 1 TABLET BY MOUTH EVERY DAY 05/17/18   Roberto [name]  Multiple Vitamin (MULTIVITAMIN WITH MINERALS) TABS tablet Take 1 tablet by mouth daily.    Provider, Historical, [name]  omeprazole (PRILOSEC) 20 MG capsule TAKE 1 CAPSULE BY MOUTH EVERY DAY 12/21/17   Roberto [name]  traMADol (ULTRAM) 50 MG tablet TAKE 1 OR 2 TABLETS BY MOUTH EVERY 6 TO 8 HOURS AS  NEEDED FOR PAIN 05/07/18   Provider, Historical, [name]  TRINTELLIX 10 MG TABS tablet TAKE 1 TABLET BY MOUTH EVERY DAY 02/19/18   Roberto [name]     No Known Allergies   Family History  Problem Relation Age of Onset  . Depression Mother   . Hearing loss Mother   . Hyperlipidemia Mother   . Hypertension Mother   . Miscarriages / [place] Mother   . Diabetes Father   . Hearing loss Father   . Hyperlipidemia Father   . Hypertension Father   . Alcohol abuse Brother   . Depression Brother   . Drug abuse Brother   . Depression Maternal Uncle   . Arthritis Maternal Grandmother   . Cancer Maternal Grandmother   . Depression Maternal Grandmother   . Hearing loss Maternal Grandmother   . Heart disease Maternal Grandmother   . Hypertension Maternal Grandmother   . Depression Maternal Grandfather   . Cancer Paternal Grandmother   . Heart disease Paternal Grandmother   . Alcohol abuse Paternal Grandfather   . Cancer Paternal Grandfather   . Heart disease Paternal Grandfather   . Hyperlipidemia Paternal Grandfather   . Hypertension Paternal Grandfather   . Miscarriages / Stillbirths Paternal Grandfather        retinitis pigmentosis     Social History   Socioeconomic History  . Marital status: Married    Spouse name: Not on file  . Number of children: Not on  file  . Years of education: Not on file  . Highest education level: Not on file  Occupational History  . Not on file  Social Needs  . Financial resource strain: Not on file  . Food insecurity:    Worry: Not on file    Inability: Not on file  . Transportation needs:    Medical: Not on file    Non-medical: Not on file  Tobacco Use  . Smoking status: Former Smoker    Types: E-cigarettes    Last attempt to quit: 03/31/2014    Years since quitting: 4.1  . Smokeless tobacco: Never Used  Substance and Sexual Activity  . Alcohol use: Yes  . Drug use: No  . Sexual activity: Never    Birth control/protection:  Condom  Lifestyle  . Physical activity:    Days per week: Not on file    Minutes per session: Not on file  . Stress: Not on file  Relationships  . Social connections:    Talks on phone: Not on file    Gets together: Not on file    Attends religious service: Not on file    Active member of club or organization: Not on file    Attends meetings of clubs or organizations: Not on file    Relationship status: Not on file  . Intimate partner violence:    Fear of current or ex partner: Not on file    Emotionally abused: Not on file    Physically abused: Not on file    Forced sexual activity: Not on file  Other Topics Concern  . Not on file  Social History Narrative   Works Office manager.    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 Negative for chills and weight loss.  HENT: Negative.   Eyes: Negative.   Respiratory: Negative.  Negative for cough.   Cardiovascular: Negative.   Gastrointestinal: Positive for abdominal pain and diarrhea. Negative for bloating, flatus and vomiting.  Endocrine: Negative.   Genitourinary: Negative.   Musculoskeletal: Negative.  Negative for arthralgias and myalgias.  Skin: Negative.   Allergic/Immunologic: Negative.   Neurological: Negative.  Negative for headaches.  Hematological: Negative.   Psychiatric/Behavioral: Negative.   All other systems reviewed and are negative.      Objective:    Vitals:   05/27/18 1436  BP: 110/70  Pulse: 96  Resp: 15  Temp: 97.7 F (36.5 C)  TempSrc: Oral  SpO2: 100%  Weight: (!) 394 lb 4 oz (178.8 kg)  Height: 6\' 9"  (2.057 m)      Physical Exam Vitals signs and nursing note reviewed.  Constitutional:      General: He is not in acute distress.    Appearance: He is well-developed. He is obese. He is not ill-appearing, toxic-appearing or diaphoretic.  HENT:     Head: Normocephalic and atraumatic.     Nose: Nose normal.      Mouth/Throat:     Mouth: Mucous membranes are moist.     Pharynx: Oropharynx is clear.  Eyes:     General:        Right eye: No discharge.        Left eye: No discharge.     Conjunctiva/sclera: Conjunctivae normal.  Neck:     Trachea: No tracheal deviation.  Cardiovascular:     Rate and Rhythm: Normal rate and regular rhythm.  Pulmonary:  Effort: Pulmonary effort is normal. No respiratory distress.     Breath sounds: No stridor.  Abdominal:     General: Abdomen is protuberant. Bowel sounds are normal.     Tenderness: There is no abdominal tenderness. There is no right CVA tenderness, left CVA tenderness, guarding or rebound. Negative signs include Murphy's sign and McBurney's sign.     Hernia: No hernia is present.  Musculoskeletal: Normal range of motion.  Skin:    General: Skin is warm and dry.     Findings: No rash.  Neurological:     Mental Status: He is alert.     Motor: No abnormal muscle tone.     Coordination: Coordination normal.  Psychiatric:        Behavior: Behavior normal.           Assessment & Plan:      ICD-10-CM   1. Gastroenteritis K52.9     GI sx, N some cramping that is already resolved, diarrhea, getting better, fever and sweats responding to over-the-counter medications, he is tolerating p.o.'s he had [place] food last night he is urinating fine, his wife is ill with same symptoms she is currently taking their children to the doctors with similar symptoms.  Suspect he has had GI virus, discussed diet for diarrhea, complete fluids, follow-up precautions.  Patient denies any current nausea has not had any vomiting he declined any antiemetics.     Danelle Berry, PA-C 05/27/18 2:47 PM

## 2018-05-27 NOTE — Patient Instructions (Signed)
Viral Gastroenteritis, Adult    Viral gastroenteritis is also known as the stomach flu. This condition is caused by certain germs (viruses). These germs can be passed from person to person very easily (are very contagious). This condition can cause sudden watery poop (diarrhea), fever, and throwing up (vomiting).  Having watery poop and throwing up can make you feel weak and cause you to get dehydrated. Dehydration can make you tired and thirsty, make you have a dry mouth, and make it so you pee (urinate) less often. Older adults and people with other diseases or a weak defense system (immune system) are at higher risk for dehydration. It is important to replace the fluids that you lose from having watery poop and throwing up.  Follow these instructions at home:  Follow instructions from your doctor about how to care for yourself at home.  Eating and drinking  Follow these instructions as told by your doctor:   Take an oral rehydration solution (ORS). This is a drink that is sold at pharmacies and stores.   Drink clear fluids in small amounts as you are able, such as:  ? Water.  ? Ice chips.  ? Diluted fruit juice.  ? Low-calorie sports drinks.   Eat bland, easy-to-digest foods in small amounts as you are able, such as:  ? Bananas.  ? Applesauce.  ? Rice.  ? Low-fat (lean) meats.  ? Toast.  ? Crackers.   Avoid fluids that have a lot of sugar or caffeine in them.   Avoid alcohol.   Avoid spicy or fatty foods.  General instructions     Drink enough fluid to keep your pee (urine) clear or pale yellow.   Wash your hands often. If you cannot use soap and water, use hand sanitizer.   Make sure that all people in your home wash their hands well and often.   Rest at home while you get better.   Take over-the-counter and prescription medicines only as told by your doctor.   Watch your condition for any changes.   Take a warm bath to help with any burning or pain from having watery poop.   Keep all follow-up  visits as told by your doctor. This is important.  Contact a doctor if:   You cannot keep fluids down.   Your symptoms get worse.   You have new symptoms.   You feel light-headed or dizzy.   You have muscle cramps.  Get help right away if:   You have chest pain.   You feel very weak or you pass out (faint).   You see blood in your throw-up.   Your throw-up looks like coffee grounds.   You have bloody or black poop (stools) or poop that look like tar.   You have a very bad headache, a stiff neck, or both.   You have a rash.   You have very bad pain, cramping, or bloating in your belly (abdomen).   You have trouble breathing.   You are breathing very quickly.   Your heart is beating very quickly.   Your skin feels cold and clammy.   You feel confused.   You have pain when you pee.   You have signs of dehydration, such as:  ? Dark pee, hardly any pee, or no pee.  ? Cracked lips.  ? Dry mouth.  ? Sunken eyes.  ? Sleepiness.  ? Weakness.  This information is not intended to replace advice given to you by your   health care provider. Make sure you discuss any questions you have with your health care provider.  Document Released: 10/08/2007 Document Revised: 01/13/2018 Document Reviewed: 12/26/2014  Elsevier Interactive Patient Education  2019 Elsevier Inc[employer]

## 2018-05-28 ENCOUNTER — Encounter: Payer: Self-pay | Admitting: Family Medicine

## 2018-06-09 ENCOUNTER — Other Ambulatory Visit: Payer: Self-pay | Admitting: Family Medicine

## 2018-07-26 ENCOUNTER — Telehealth: Payer: Self-pay | Admitting: Family Medicine

## 2018-07-26 NOTE — Telephone Encounter (Signed)
Make telephone visit for tomorrow to determine

## 2018-07-26 NOTE — Telephone Encounter (Signed)
Patient's wife called in stating that patient has c/o fever, cough, sore throat, dizziness, and diarrhea. Patient was wondering if he could come in for COVID-19 testing. Advised patient at this time we are not doing any COVID-19 testing in office. It is now recommended to stay home and treat with symptomatic care with otc medications. Advised patient to drink adequate amounts of fluids and remain in his home for at least 3 days of being fever free without having to take any medications for fevers. Patient verbalized understanding.

## 2018-07-26 NOTE — Telephone Encounter (Signed)
Patient called in earlier today with c/o low grade fever, diarrhea, cough, and sore throat. He would like to know if you can write him a work note to be out of work from 07/23/18-07/30/2018. Please advise?

## 2018-07-27 ENCOUNTER — Ambulatory Visit (INDEPENDENT_AMBULATORY_CARE_PROVIDER_SITE_OTHER): Payer: BC Managed Care – PPO | Admitting: Family Medicine

## 2018-07-27 ENCOUNTER — Encounter: Payer: Self-pay | Admitting: Family Medicine

## 2018-07-27 ENCOUNTER — Other Ambulatory Visit: Payer: Self-pay

## 2018-07-27 DIAGNOSIS — K529 Noninfective gastroenteritis and colitis, unspecified: Secondary | ICD-10-CM

## 2018-07-27 NOTE — Progress Notes (Addendum)
Subjective:    Patient ID: Roberto [name], male    DOB: [birth year], 28 y.o.   MRN: [id number]  HPI Patient is being seen today via telephone visit.  He is consenting to a telephone visit.  Telephone visit began at 940.  Patient is at home.  I am at my office.  Patient states that Sunday, March 22 he developed a fever to 100.3.  Monday, the patient developed watery diarrhea.  He denies any bloody diarrhea or abdominal pain.  His fever returned yesterday evening.  It was a low-grade fever.  He did not check the temperature.  He has not had a fever today however he has considerable fatigue.  He also reports a light cough.  The cough is nonproductive.  He also has a scratchy throat.  He denies any chest pain.  He denies any shortness of breath.  He denies any nausea or vomiting.  He denies any travel.  He denies any sick exposure.  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  There were no sick contacts at work.  He denies any sinus pain.  He denies any rhinorrhea.  He denies any otalgia.  He denies any pleurisy. Past Medical History:  Diagnosis Date  . Benign essential HTN   . Depression   . Fatty liver disease, nonalcoholic   . H/O: pneumonia    as infant  . HLD (hyperlipidemia)    No past surgical history on file. Current Outpatient Medications on File Prior to Visit  Medication Sig Dispense Refill  . Acetaminophen-Caffeine (EXCEDRIN TENSION HEADACHE) 500-65 MG TABS Take by mouth.    . cyclobenzaprine (FLEXERIL) 5 MG tablet TAKE 1 OR 2 TABLETS BY MOUTH AT BEDTIME AS NEEDED FOR MUSCLE SPASM/TIGHTNESS    . losartan (COZAAR) 100 MG tablet TAKE 1 TABLET BY MOUTH EVERY DAY 90 tablet 1  . Multiple Vitamin (MULTIVITAMIN WITH MINERALS) TABS tablet Take 1 tablet by mouth daily.    Marland Kitchen omeprazole (PRILOSEC) 20 MG capsule TAKE 1 CAPSULE BY MOUTH EVERY DAY 90 capsule 3  . traMADol (ULTRAM) 50 MG tablet TAKE 1 OR 2 TABLETS BY MOUTH EVERY 6 TO 8 HOURS AS NEEDED FOR PAIN    . TRINTELLIX 10 MG TABS  tablet TAKE 1 TABLET BY MOUTH EVERY DAY 30 tablet 5   No current facility-administered medications on file prior to visit.    No Known Allergies Social History   Socioeconomic History  . Marital status: Married    Spouse name: Not on file  . Number of children: Not on file  . Years of education: Not on file  . Highest education level: Not on file  Occupational History  . Not on file  Social Needs  . Financial resource strain: Not on file  . Food insecurity:    Worry: Not on file    Inability: Not on file  . Transportation needs:    Medical: Not on file    Non-medical: Not on file  Tobacco Use  . Smoking status: Former Smoker    Types: E-cigarettes    Last attempt to quit: 03/31/2014    Years since quitting: 4.3  . Smokeless tobacco: Never Used  Substance and Sexual Activity  . Alcohol use: Yes  . Drug use: No  . Sexual activity: Never    Birth control/protection: Condom  Lifestyle  . Physical activity:    Days per week: Not on file    Minutes per session: Not on file  . Stress: Not on  file  Relationships  . Social connections:    Talks on phone: Not on file    Gets together: Not on file    Attends religious service: Not on file    Active member of club or organization: Not on file    Attends meetings of clubs or organizations: Not on file    Relationship status: Not on file  . Intimate partner violence:    Fear of current or ex partner: Not on file    Emotionally abused: Not on file    Physically abused: Not on file    Forced sexual activity: Not on file  Other Topics Concern  . Not on file  Social History Narrative   Works Office manager.    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      Objective:   Physical Exam No physical exam was performed as the patient was seen via telephone encounter       Assessment & Plan:  Gastroenteritis  Symptoms sound  consistent with a viral syndrome/gastroenteritis.  I have a low index of suspicion for coronavirus.  I suspect that this is a very virus.  I have recommended symptomatic care.  He can use Tylenol for fever or body aches.  He can use Imodium as needed for diarrhea.  I recommended rest and pushing fluids.  I recommended self quarantine the remainder of the week.  He should be cleared to return to work next Monday unless symptoms change.  Therefore his office note will state that the patient became sick on March 22 and should be out of work from March 22 through March 27.  I recommended that the patient call me back should his symptoms worsen or change in any way.  He also mentions a lymph node roughly the size of a marble below his chin on the right side.  He states that it is nontender however it is slightly enlarged.  He states is been like that for 2 or 3 weeks.  I recommended that we monitor this for an additional 2 weeks.  If it grows or becomes painful I would recommend an ultrasound to evaluate further and possibly antibiotics for possible cat scratch disease as the patient does have a cat.  However we will monitor it at the present time.  Office visit concluded at 959.

## 2018-07-27 NOTE — Telephone Encounter (Signed)
Spoke with patient and scheduled him for a telephone visit. Patient verbalized understanding.

## 2018-08-09 ENCOUNTER — Other Ambulatory Visit: Payer: Self-pay | Admitting: Family Medicine

## 2018-08-09 MED ORDER — VORTIOXETINE HBR 10 MG PO TABS: 10.0000 mg | ORAL_TABLET | Freq: Every day | ORAL | 5 refills | Status: DC

## 2018-09-10 ENCOUNTER — Encounter
Payer: No Typology Code available for payment source | Attending: Physical Medicine & Rehabilitation | Admitting: Physical Medicine & Rehabilitation

## 2018-09-10 ENCOUNTER — Other Ambulatory Visit: Payer: Self-pay

## 2018-09-10 VITALS — BP 119/72 | HR 95 | Temp 98.4°F | Ht >= 80 in | Wt >= 6400 oz

## 2018-09-10 DIAGNOSIS — M79669 Pain in unspecified lower leg: Secondary | ICD-10-CM | POA: Insufficient documentation

## 2018-09-10 NOTE — Progress Notes (Signed)
EMG /NCV of both lower limbs with Needle EMG of Lumbar paraspinal and both lower limbs  Needle EMG was normal  NCV demonstrated absent Bilateral sural and sup peroneal sensory responses.  Low amplitude Left tibial motor distal latency with absent proximal as well as reduced amplitude peroneal motor on left .THis is most consistent with peripheral neuropathy.  See Media for full report

## 2018-09-10 NOTE — Patient Instructions (Signed)
Electromyoneurogram  Electromyoneurogram is a test to check how well your muscles and nerves are working. This procedure includes the combined use of electromyogram (EMG) and nerve conduction study (NCS). EMG is used to look for muscular disorders. NCS, which is also called electroneurogram, measures how well your nerves are controlling your muscles.  The procedures are usually performed together to check if your muscles and nerves are healthy. If the reaction to testing is abnormal, this can indicate disease or injury, such as peripheral nerve damage.  Tell a health care provider about:   Any allergies you have.   All medicines you are taking, including vitamins, herbs, eye drops, creams, and over-the-counter medicines.   Any problems you or family members have had with anesthetic medicines.   Any blood disorders you have.   Any surgeries you have had.   Any medical conditions you have.   Any pacemaker you have.  What are the risks?  Generally, this is a safe procedure. However, problems may occur, including:   Infection where the electrodes were inserted.   Bleeding.  What happens before the procedure?   Ask your health care provider about:  ? Changing or stopping your regular medicines. This is especially important if you are taking diabetes medicines or blood thinners.  ? Taking medicines such as aspirin and ibuprofen. These medicines can thin your blood. Do not take these medicines before your procedure if your health care provider instructs you not to.   Your health care provider may ask you to avoid:  ? Caffeine, such as coffee and tea.  ? Nicotine. This includes cigarettes and anything with tobacco.   Do not use lotions or creams on the same day that you will be having the procedure.  What happens during the procedure?  For EMG:   Your health care provider will ask you to stay in a position so that he or she can access the muscle that will be studied. You may be standing, sitting down, or lying  down.   You may be given a medicine that numbs the area (local anesthetic).   A very thin needle that has an electrode on it will be inserted into your muscle.   Another small electrode will be placed on your skin near the muscle.   Your health care provider will ask you to continue to remain still.   The electrodes will send a signal that tells about the electrical activity of your muscles. You may see this on a monitor or hear it in the room.   After your muscles have been studied at rest, your health care provider will ask you to contract or flex your muscles. The electrodes will send a signal that tells about the electrical activity of your muscles.   Your health care provider will remove the electrodes and the electrode needles when the procedure is finished.  The procedure may vary among health care providers and hospitals.  For NCS:   An electrode that records your nerve activity (recording electrode) will be placed on your skin by the muscle that is being studied.   An electrode that is used as a reference (reference electrode) will be placed near the recording electrode.   A paste or gel will be applied to your skin between the recording electrode and the reference electrode.   Your nerve will be stimulated with a mild shock. Your health care provider will measure how much time it takes for your muscle to react.   Your health   care provider will remove the electrodes and the gel when the procedure is finished.  The procedure may vary among health care providers and hospitals.  What happens after the procedure?   It is your responsibility to obtain your test results. Ask your health care provider or the department performing the test when and how you will get your results.   Your health care provider may:  ? Give you medicines for any pain.  ? Monitor the insertion sites to make sure that they stop bleeding.  This information is not intended to replace advice given to you by your health care  provider. Make sure you discuss any questions you have with your health care provider.  Document Released: 08/22/2004 Document Revised: 09/27/2015 Document Reviewed: 06/12/2014  Elsevier Interactive Patient Education  2019 [employer] Inc[employer]

## 2018-09-19 ENCOUNTER — Other Ambulatory Visit: Payer: Self-pay

## 2018-09-19 ENCOUNTER — Emergency Department (HOSPITAL_COMMUNITY)
Admission: EM | Admit: 2018-09-19 | Discharge: 2018-09-19 | Disposition: A | Discharge status: Home / Self Care | Payer: BC Managed Care – PPO | Attending: Emergency Medicine | Admitting: Emergency Medicine

## 2018-09-19 ENCOUNTER — Emergency Department (HOSPITAL_COMMUNITY): Payer: BC Managed Care – PPO

## 2018-09-19 DIAGNOSIS — Z87891 Personal history of nicotine dependence: Secondary | ICD-10-CM | POA: Insufficient documentation

## 2018-09-19 DIAGNOSIS — N201 Calculus of ureter: Secondary | ICD-10-CM | POA: Insufficient documentation

## 2018-09-19 DIAGNOSIS — R109 Unspecified abdominal pain: Secondary | ICD-10-CM | POA: Diagnosis present

## 2018-09-19 DIAGNOSIS — I10 Essential (primary) hypertension: Secondary | ICD-10-CM | POA: Diagnosis not present

## 2018-09-19 DIAGNOSIS — Z79899 Other long term (current) drug therapy: Secondary | ICD-10-CM | POA: Diagnosis not present

## 2018-09-19 LAB — CBC WITH DIFFERENTIAL/PLATELET
Abs Immature Granulocytes: 0.01 10*3/uL (ref 0.00–0.07)
Basophils Absolute: 0 10*3/uL (ref 0.0–0.1)
Basophils Relative: 1 %
Eosinophils Absolute: 0.1 10*3/uL (ref 0.0–0.5)
Eosinophils Relative: 1 %
HCT: 40.9 % (ref 39.0–52.0)
Hemoglobin: 14 g/dL (ref 13.0–17.0)
Immature Granulocytes: 0 %
Lymphocytes Relative: 41 %
Lymphs Abs: 1.9 10*3/uL (ref 0.7–4.0)
MCH: 28.4 pg (ref 26.0–34.0)
MCHC: 34.2 g/dL (ref 30.0–36.0)
MCV: 83 fL (ref 80.0–100.0)
Monocytes Absolute: 0.2 10*3/uL (ref 0.1–1.0)
Monocytes Relative: 5 %
Neutro Abs: 2.3 10*3/uL (ref 1.7–7.7)
Neutrophils Relative %: 52 %
Platelets: 216 10*3/uL (ref 150–400)
RBC: 4.93 MIL/uL (ref 4.22–5.81)
RDW: 12.7 % (ref 11.5–15.5)
WBC: 4.5 10*3/uL (ref 4.0–10.5)
nRBC: 0 % (ref 0.0–0.2)

## 2018-09-19 LAB — URINALYSIS, ROUTINE W REFLEX MICROSCOPIC
Bilirubin Urine: NEGATIVE
Glucose, UA: NEGATIVE mg/dL
Ketones, ur: NEGATIVE mg/dL
Nitrite: NEGATIVE
Protein, ur: NEGATIVE mg/dL
Specific Gravity, Urine: 1.033 — ABNORMAL HIGH (ref 1.005–1.030)
pH: 5 (ref 5.0–8.0)

## 2018-09-19 LAB — COMPREHENSIVE METABOLIC PANEL
ALT: 48 U/L — ABNORMAL HIGH (ref 0–44)
AST: 35 U/L (ref 15–41)
Albumin: 3.8 g/dL (ref 3.5–5.0)
Alkaline Phosphatase: 53 U/L (ref 38–126)
Anion gap: 7 (ref 5–15)
BUN: 17 mg/dL (ref 6–20)
CO2: 23 mmol/L (ref 22–32)
Calcium: 9 mg/dL (ref 8.9–10.3)
Chloride: 111 mmol/L (ref 98–111)
Creatinine, Ser: 1.28 mg/dL — ABNORMAL HIGH (ref 0.61–1.24)
GFR calc Af Amer: >60 mL/min (ref >60)
GFR calc non Af Amer: >60 mL/min (ref >60)
Glucose, Bld: 141 mg/dL — ABNORMAL HIGH (ref 70–99)
Potassium: 3.6 mmol/L (ref 3.5–5.1)
Sodium: 141 mmol/L (ref 135–145)
Total Bilirubin: 0.5 mg/dL (ref 0.3–1.2)
Total Protein: 6.1 g/dL — ABNORMAL LOW (ref 6.5–8.1)

## 2018-09-19 LAB — LIPASE, BLOOD: Lipase: 26 U/L (ref 11–51)

## 2018-09-19 MED ORDER — ONDANSETRON HCL 4 MG/2ML IJ SOLN
4.0000 mg | Freq: Once | INTRAMUSCULAR | Status: AC
  Administered 2018-09-19: 4 mg via INTRAVENOUS
  Filled 2018-09-19: qty 2

## 2018-09-19 MED ORDER — HYDROMORPHONE HCL 1 MG/ML IJ SOLN
1.0000 mg | Freq: Once | INTRAMUSCULAR | Status: AC
  Administered 2018-09-19: 1 mg via INTRAVENOUS
  Filled 2018-09-19: qty 1

## 2018-09-19 MED ORDER — MORPHINE SULFATE (PF) 4 MG/ML IV SOLN
4.0000 mg | Freq: Once | INTRAVENOUS | Status: AC
  Administered 2018-09-19: 4 mg via INTRAVENOUS
  Filled 2018-09-19: qty 1

## 2018-09-19 MED ORDER — ONDANSETRON 4 MG PO TBDP: 4.0000 mg | ORAL_TABLET | Freq: Three times a day (TID) | ORAL | 0 refills | Status: DC | PRN

## 2018-09-19 MED ORDER — IBUPROFEN 600 MG PO TABS: 600.0000 mg | ORAL_TABLET | Freq: Four times a day (QID) | ORAL | 0 refills | Status: DC | PRN

## 2018-09-19 MED ORDER — KETOROLAC TROMETHAMINE 30 MG/ML IJ SOLN
30.0000 mg | Freq: Once | INTRAMUSCULAR | Status: AC
  Administered 2018-09-19: 30 mg via INTRAVENOUS
  Filled 2018-09-19: qty 1

## 2018-09-19 MED ORDER — OXYCODONE HCL 5 MG PO TABS: 5.0000 mg | ORAL_TABLET | ORAL | 0 refills | Status: DC | PRN

## 2018-09-19 MED ORDER — TRAMADOL HCL 50 MG PO TABS: 50.0000 mg | ORAL_TABLET | Freq: Four times a day (QID) | ORAL | 0 refills | Status: DC | PRN

## 2018-09-19 NOTE — ED Notes (Signed)
Patient verbalizes understanding of discharge instructions. Opportunity for questioning and answers were provided. Armband removed by staff, pt discharged from ED.  

## 2018-09-19 NOTE — ED Provider Notes (Signed)
[facility] EMERGENCY DEPARTMENT Provider Note   CSN: [id number] Arrival date & time: 09/19/18  0754    History   Chief Complaint Chief Complaint  Patient presents with   Flank Pain    HPI Roberto [name] is a 28 y.o. male who presents with acute R flank pain. PMH significant for chronic back pain. He states that this is very different from his typical back pain. It is over the R flank, constant, severe. It started all of a sudden around 7AM and has gradually worsened. It radiates to the groin. He has had associated N/V and feels hot. He is also straining to urinate. He denies hx of kidney stone. No fever, constipation, hematuria, penis or testicular pain.     HPI  Past Medical History:  Diagnosis Date   Benign essential HTN    Depression    Fatty liver disease, nonalcoholic    H/O: pneumonia    as infant   HLD (hyperlipidemia)     Patient Active Problem List   Diagnosis Date Noted   Elevated LFTs 06/12/2014   Obesity 06/05/2014   Depression 06/05/2014   Obstructive sleep apnea 06/05/2014   Esophageal reflux 06/05/2014    No past surgical history on file.      Home Medications    Prior to Admission medications   Medication Sig Start Date End Date Taking? Authorizing Provider  Acetaminophen-Caffeine (EXCEDRIN TENSION HEADACHE) 500-65 MG TABS Take by mouth.    [provider]  cyclobenzaprine (FLEXERIL) 5 MG tablet TAKE 1 OR 2 TABLETS BY MOUTH AT BEDTIME AS NEEDED FOR MUSCLE SPASM/TIGHTNESS 05/06/18   [provider]  losartan (COZAAR) 100 MG tablet TAKE 1 TABLET BY MOUTH EVERY DAY 06/10/18   Donita Brooks, MD  Multiple Vitamin (MULTIVITAMIN WITH MINERALS) TABS tablet Take 1 tablet by mouth daily.    [provider]  omeprazole (PRILOSEC) 20 MG capsule TAKE 1 CAPSULE BY MOUTH EVERY DAY 12/21/17   Donita Brooks, MD  traMADol (ULTRAM) 50 MG tablet TAKE 1 OR 2 TABLETS BY MOUTH EVERY 6 TO 8 HOURS AS NEEDED FOR  PAIN 05/07/18   [provider]  vortioxetine HBr (TRINTELLIX) 10 MG TABS tablet Take 1 tablet (10 mg total) by mouth daily. 08/09/18   Donita Brooks, MD    Family History Family History  Problem Relation Age of Onset   Depression Mother    Hearing loss Mother    Hyperlipidemia Mother    Hypertension Mother    Miscarriages / [place] Mother    Diabetes Father    Hearing loss Father    Hyperlipidemia Father    Hypertension Father    Alcohol abuse Brother    Depression Brother    Drug abuse Brother    Depression Maternal Uncle    Arthritis Maternal Grandmother    Cancer Maternal Grandmother    Depression Maternal Grandmother    Hearing loss Maternal Grandmother    Heart disease Maternal Grandmother    Hypertension Maternal Grandmother    Depression Maternal Grandfather    Cancer Paternal Grandmother    Heart disease Paternal Grandmother    Alcohol abuse Paternal Grandfather    Cancer Paternal Grandfather    Heart disease Paternal Grandfather    Hyperlipidemia Paternal Grandfather    Hypertension Paternal Grandfather    Miscarriages / Stillbirths Paternal Grandfather        retinitis pigmentosis    Social History Social History   Tobacco Use   Smoking status:  Former Smoker    Types: E-cigarettes    Last attempt to quit: 03/31/2014    Years since quitting: 4.4   Smokeless tobacco: Never Used  Substance Use Topics   Alcohol use: Yes   Drug use: No     Allergies   Patient has no known allergies.   Review of Systems Review of Systems  Constitutional: Negative for fever.  Respiratory: Negative for shortness of breath.   Cardiovascular: Negative for chest pain.  Gastrointestinal: Positive for abdominal pain, diarrhea, nausea and vomiting.  Genitourinary: Positive for difficulty urinating and flank pain. Negative for dysuria, hematuria, penile pain and testicular pain.  All other systems reviewed and are  negative.    Physical Exam Updated Vital Signs BP (!) 151/108 (BP Location: Right Arm)    Pulse 84    Temp 97.9 F (36.6 C) (Oral)    Resp 20    Ht 6\' 9"  (2.057 m)    Wt (!) 189.1 kg    SpO2 100%    BMI 44.69 kg/m   Physical Exam Vitals signs and nursing note reviewed.  Constitutional:      General: He is in acute distress.     Appearance: Normal appearance. He is well-developed. He is obese.  HENT:     Head: Normocephalic and atraumatic.  Eyes:     General: No scleral icterus.       Right eye: No discharge.        Left eye: No discharge.     Conjunctiva/sclera: Conjunctivae normal.     Pupils: Pupils are equal, round, and reactive to light.  Neck:     Musculoskeletal: Normal range of motion.  Cardiovascular:     Rate and Rhythm: Normal rate and regular rhythm.  Pulmonary:     Effort: Pulmonary effort is normal. No respiratory distress.     Breath sounds: Normal breath sounds.  Abdominal:     General: There is no distension.     Palpations: Abdomen is soft.     Tenderness: There is abdominal tenderness. There is right CVA tenderness.  Genitourinary:    Comments: No inguinal lymphadenopathy or inguinal hernia noted. Normal circumcised penis free of lesions or rash. Testicles are nontender with normal lie. Normal scrotal appearance. No obvious discharge noted. Chaperone present during exam.   Skin:    General: Skin is warm and dry.  Neurological:     Mental Status: He is alert and oriented to person, place, and time.  Psychiatric:        Behavior: Behavior normal.      ED Treatments / Results  Labs (all labs ordered are listed, but only abnormal results are displayed) Labs Reviewed  URINALYSIS, ROUTINE W REFLEX MICROSCOPIC - Abnormal; Notable for the following components:      Result Value   APPearance CLOUDY (*)    Specific Gravity, Urine 1.033 (*)    Hgb urine dipstick SMALL (*)    Leukocytes,Ua TRACE (*)    Bacteria, UA RARE (*)    All other components  within normal limits  COMPREHENSIVE METABOLIC PANEL - Abnormal; Notable for the following components:   Glucose, Bld 141 (*)    Creatinine, Ser 1.28 (*)    Total Protein 6.1 (*)    ALT 48 (*)    All other components within normal limits  CBC WITH DIFFERENTIAL/PLATELET  LIPASE, BLOOD    EKG None  Radiology Ct Renal Stone Study  Result Date: 09/19/2018 CLINICAL DATA:  Right flank pain. EXAM: CT  ABDOMEN AND PELVIS WITHOUT CONTRAST TECHNIQUE: Multidetector CT imaging of the abdomen and pelvis was performed following the standard protocol without IV contrast. COMPARISON:  None. FINDINGS: Lower chest: No acute abnormality. Hepatobiliary: Hepatic steatosis. The liver and gallbladder are otherwise normal. Pancreas: Unremarkable. No pancreatic ductal dilatation or surrounding inflammatory changes. Spleen: Normal in size without focal abnormality. Adrenals/Urinary Tract: No renal stones identified. Mild hydronephrosis identified on the left with mild prominence of the left ureter. There is a 3.8 mm calcification in the right posterior bladder, likely recently passed stone accounting for the right hydronephrosis. No left hydronephrosis or ureterectasis. The bladder is otherwise unremarkable. Stomach/Bowel: Stomach is within normal limits. Appendix appears normal. No evidence of bowel wall thickening, distention, or inflammatory changes. Vascular/Lymphatic: No significant vascular findings are present. No enlarged abdominal or pelvic lymph nodes. Reproductive: Prostate is unremarkable. Other: No abdominal wall hernia or abnormality. No abdominopelvic ascites. Musculoskeletal: No acute or significant osseous findings. IMPRESSION: 1. There is a 3.8 mm stone in the right posterior bladder, consistent with a recently passed stone explaining the mild right hydronephrosis and ureterectasis. 2. Hepatic steatosis. Electronically Signed   By: Gerome Samavid  Williams III M.D   On: 09/19/2018 09:25    Procedures Procedures  (including critical care time)  Medications Ordered in ED Medications  ketorolac (TORADOL) 30 MG/ML injection 30 mg (30 mg Intravenous Given 09/19/18 0837)  HYDROmorphone (DILAUDID) injection 1 mg (1 mg Intravenous Given 09/19/18 0838)  ondansetron (ZOFRAN) injection 4 mg (4 mg Intravenous Given 09/19/18 0837)  morphine 4 MG/ML injection 4 mg (4 mg Intravenous Given 09/19/18 1032)     Initial Impression / Assessment and Plan / ED Course  I have reviewed the triage vital signs and the nursing notes.  Pertinent labs & imaging results that were available during my care of the patient were reviewed by me and considered in my medical decision making (see chart for details).  28 year old male presents with acute R flank pain. He is hypertensive initially which is likely from pain. He is tender over the R flank. GU exam is unremarkable. Will order labs, UA, CT renal and meds.  CT shows 3.408mm stone in the bladder with mild swelling or the ureter and kidney. On recheck pt appears much more comfortable. Pain is 4/10. CBC is normal. CMP is remarkable for mildly elevated SCr (1.28). UA has small hgb, trace leukocytes, rare bacteria, 6-10 WBC. Does not look like obvious UTI thus will not treat with antibiotics. Will rx pain medicine, Zofran, Ibuprofen. Urology f/u given.  Final Clinical Impressions(s) / ED Diagnoses   Final diagnoses:  Right ureteral calculus    ED Discharge Orders    None       Bethel BornGekas, Seletha Zimmermann Marie, PA-C 09/19/18 1055    Tegeler, Canary Brimhristopher J, MD 09/19/18 1228

## 2018-09-19 NOTE — Discharge Instructions (Signed)
The CT scan today showed a 69mm kidney stone which is in the bladder. This should pass after a couple days Drink plenty of fluids Take narcotic pain medicine as needed. Do not drink alcohol or drive while taking this medicine Ibuprofen 600mg  - Take for pain and inflammation. Take with food three times daily Take Zofran for nausea Follow up with Urology if you are not improving Return if symptoms are worsening

## 2018-09-19 NOTE — ED Notes (Signed)
Patient transported to CT 

## 2018-09-19 NOTE — ED Triage Notes (Signed)
Pt here for right flank pain that started 30 minutes ago and has vomited x 2 five minutes after the pain. Endorses loose stool over  The last two days.

## 2018-11-12 ENCOUNTER — Ambulatory Visit: Payer: BC Managed Care – PPO | Admitting: Family Medicine

## 2018-12-02 ENCOUNTER — Other Ambulatory Visit: Payer: Self-pay | Admitting: Family Medicine

## 2018-12-30 ENCOUNTER — Other Ambulatory Visit: Payer: Self-pay

## 2018-12-31 ENCOUNTER — Ambulatory Visit (INDEPENDENT_AMBULATORY_CARE_PROVIDER_SITE_OTHER): Payer: BC Managed Care – PPO | Admitting: Family Medicine

## 2018-12-31 ENCOUNTER — Other Ambulatory Visit: Payer: Self-pay

## 2018-12-31 VITALS — BP 150/94 | HR 72 | Temp 98.5°F | Resp 16 | Ht >= 80 in | Wt 378.0 lb

## 2018-12-31 DIAGNOSIS — H6093 Unspecified otitis externa, bilateral: Secondary | ICD-10-CM | POA: Diagnosis not present

## 2018-12-31 DIAGNOSIS — M545 Low back pain, unspecified: Secondary | ICD-10-CM

## 2018-12-31 DIAGNOSIS — G8929 Other chronic pain: Secondary | ICD-10-CM | POA: Diagnosis not present

## 2018-12-31 MED ORDER — TRAMADOL HCL 50 MG PO TABS: 50.0000 mg | ORAL_TABLET | Freq: Three times a day (TID) | ORAL | 0 refills | Status: AC | PRN

## 2018-12-31 MED ORDER — NEOMYCIN-POLYMYXIN-HC 3.5-10000-1 OT SOLN: 4.0000 [drp] | Freq: Four times a day (QID) | OTIC | 1 refills | Status: DC

## 2018-12-31 NOTE — Progress Notes (Signed)
Subjective:     Patient ID: Roberto [name], male   DOB: 01-May-1991, 28 y.o.   MRN: [id number]  HPI Patient presents with bilateral ear pain.  He states that the pain is been present for the last 3 to 4 weeks.  He has been doing a lot of swimming over the summer and he believes that he got swimmer's ear.  However over the last 2 weeks the pain is steadily worsened.  He complains of a pressure-like pain in both ears.  It hurts when he uses a Q-tip.  He denies any tinnitus.  He denies any hearing loss.  He denies any sinus pain.  He denies any symptoms of eustachian tube dysfunction.  He denies any fevers or chills.  On examination he does have some trace white exudate in both auditory canals however it is not impressive.  There is not significant swelling or erythema.  He denies any pain in the temporomandibular joint.  He denies any bruxism.  There is no erythema in the posterior oropharynx.  The patient also complains of low back pain.  He has been seeing a pain clinic under the care of Dr. Ace Gins.  They have tried facet joint injections, radiofrequency nerve ablations, and physical therapy.  However the only thing that seems to help his pain in his lower back when it is severe his tramadol.  He received 15 tramadol in the emergency room in May and he just now use the last one.  He only use this medication for severe breakthrough pain and he is questioning if I could give him tramadol.  I stated that I will be willing to give him 30 tramadol to last 6 months.  However I want him to notify his pain management specialist Dr. Ace Gins and also clear this with their office to avoid the appearance of doctor shopping. Past Medical History:  Diagnosis Date  . Benign essential HTN   . Depression   . Fatty liver disease, nonalcoholic   . H/O: pneumonia    as infant  . HLD (hyperlipidemia)    No past surgical history on file. Current Outpatient Medications on File Prior to Visit  Medication Sig Dispense Refill   . losartan (COZAAR) 100 MG tablet TAKE 1 TABLET BY MOUTH EVERY DAY 90 tablet 1  . Multiple Vitamin (MULTIVITAMIN WITH MINERALS) TABS tablet Take 1 tablet by mouth daily.    Marland Kitchen omeprazole (PRILOSEC) 20 MG capsule TAKE 1 CAPSULE BY MOUTH EVERY DAY 90 capsule 3  . traMADol (ULTRAM) 50 MG tablet Take 1 tablet (50 mg total) by mouth every 6 (six) hours as needed. 15 tablet 0  . vortioxetine HBr (TRINTELLIX) 10 MG TABS tablet Take 1 tablet (10 mg total) by mouth daily. 30 tablet 5   No current facility-administered medications on file prior to visit.    No Known Allergies Social History   Socioeconomic History  . Marital status: Married    Spouse name: Not on file  . Number of children: Not on file  . Years of education: Not on file  . Highest education level: Not on file  Occupational History  . Not on file  Social Needs  . Financial resource strain: Not on file  . Food insecurity    Worry: Not on file    Inability: Not on file  . Transportation needs    Medical: Not on file    Non-medical: Not on file  Tobacco Use  . Smoking status: Former Smoker    Types:  E-cigarettes    Quit date: 03/31/2014    Years since quitting: 4.7  . Smokeless tobacco: Never Used  Substance and Sexual Activity  . Alcohol use: Yes  . Drug use: No  . Sexual activity: Never    Birth control/protection: Condom  Lifestyle  . Physical activity    Days per week: Not on file    Minutes per session: Not on file  . Stress: Not on file  Relationships  . Social Musicianconnections    Talks on phone: Not on file    Gets together: Not on file    Attends religious service: Not on file    Active member of club or organization: Not on file    Attends meetings of clubs or organizations: Not on file    Relationship status: Not on file  . Intimate partner violence    Fear of current or ex partner: Not on file    Emotionally abused: Not on file    Physically abused: Not on file    Forced sexual activity: Not on file   Other Topics Concern  . Not on file  Social History Narrative   Works Office managerDesk Job.    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      Objective:   Physical Exam Vitals signs reviewed.  Constitutional:      Appearance: He is obese.  HENT:     Right Ear: Tympanic membrane and external ear normal.     Left Ear: Tympanic membrane and external ear normal.  Cardiovascular:     Rate and Rhythm: Normal rate and regular rhythm.  Pulmonary:     Effort: Pulmonary effort is normal.     Breath sounds: Normal breath sounds.  Musculoskeletal:     Lumbar back: He exhibits decreased range of motion and pain. He exhibits no bony tenderness and no spasm.  Neurological:     Mental Status: He is alert.        Assessment:     Otitis externa of both ears, unspecified chronicity, unspecified type  Chronic midline low back pain without sciatica      Plan:       I will treat the patient for otitis externa.  His exam is not impressive.  Therefore I am concerned that there may be something else contributing to the pain in his ears.  I will give the patient Cortisporin otic 4 drops in each ear canal 4 times a day for 1 week.  If no better I want him to recheck with me.  I am willing to give the patient tramadol 50 mg 1 p.o. every 8 hours as needed severe pain.  I will give him 30 tablets with the intention that this would last 6 months.  I instructed the patient to notify his pain management specialist of this to avoid the appearance of wrongdoing.

## 2019-02-19 ENCOUNTER — Other Ambulatory Visit: Payer: Self-pay | Admitting: Family Medicine

## 2019-03-20 ENCOUNTER — Ambulatory Visit (HOSPITAL_COMMUNITY)
Admission: EM | Admit: 2019-03-20 | Discharge: 2019-03-20 | Disposition: A | Discharge status: Home / Self Care | Payer: BC Managed Care – PPO | Attending: Emergency Medicine | Admitting: Emergency Medicine

## 2019-03-20 ENCOUNTER — Other Ambulatory Visit: Payer: Self-pay

## 2019-03-20 ENCOUNTER — Encounter (HOSPITAL_COMMUNITY): Payer: Self-pay

## 2019-03-20 DIAGNOSIS — S0501XA Injury of conjunctiva and corneal abrasion without foreign body, right eye, initial encounter: Secondary | ICD-10-CM | POA: Diagnosis not present

## 2019-03-20 DIAGNOSIS — S00251A Superficial foreign body of right eyelid and periocular area, initial encounter: Secondary | ICD-10-CM

## 2019-03-20 DIAGNOSIS — Z23 Encounter for immunization: Secondary | ICD-10-CM | POA: Diagnosis not present

## 2019-03-20 MED ORDER — IBUPROFEN 600 MG PO TABS: 600.0000 mg | ORAL_TABLET | Freq: Four times a day (QID) | ORAL | 0 refills | Status: DC | PRN

## 2019-03-20 MED ORDER — TETANUS-DIPHTH-ACELL PERTUSSIS 5-2.5-18.5 LF-MCG/0.5 IM SUSP
INTRAMUSCULAR | Status: AC
  Filled 2019-03-20: qty 0.5

## 2019-03-20 MED ORDER — TETRACAINE HCL 0.5 % OP SOLN
OPHTHALMIC | Status: AC
  Filled 2019-03-20: qty 4

## 2019-03-20 MED ORDER — MOXIFLOXACIN HCL 0.5 % OP SOLN: 1.0000 [drp] | Freq: Four times a day (QID) | OPHTHALMIC | 0 refills | Status: DC

## 2019-03-20 MED ORDER — SYSTANE 0.4-0.3 % OP SOLN: 1.0000 [drp] | Freq: Four times a day (QID) | OPHTHALMIC | 0 refills | Status: DC | PRN

## 2019-03-20 MED ORDER — TETANUS-DIPHTH-ACELL PERTUSSIS 5-2.5-18.5 LF-MCG/0.5 IM SUSP
0.5000 mL | Freq: Once | INTRAMUSCULAR | Status: AC
  Administered 2019-03-20: 12:00:00 0.5 mL via INTRAMUSCULAR

## 2019-03-20 NOTE — ED Triage Notes (Signed)
Pt present foreign object in his right eye. Pt states that he notice the object in his eye last night.

## 2019-03-20 NOTE — Discharge Instructions (Addendum)
I have talked to the ophthalmologist, and they will see you tomorrow morning.  Be at the office by 9 AM and they will work you in.  Cool compresses.  Vigamox eyedrop which is an antibiotic 4 times a day.  600 mg ibuprofen combined with 1 g of Tylenol 3-4 times a day as needed for pain.  Systane eyedrops as often as you want to help lubricate and soothe your eye.  We have updated your tetanus today.

## 2019-03-20 NOTE — ED Provider Notes (Signed)
HPI  SUBJECTIVE:  Roberto [name] is a 28 y.o. male who presents with right eye pain, bilateral blurry vision, right-sided nasal congestion after "getting something in my eye" while mowing the lawn yesterday.  He describes pain as sharp, stabbing, scratching.  states that it is constant.  He denies photophobia, headache.  He does not wear contacts or glasses.  He tried irrigation, warm compresses, removing it with a Q-tip at home, ibuprofen 400 mg without improvement in her symptoms.  Symptoms are worse when he attempted to remove it with a Q-tip.  His tetanus is unknown.  Past medical history of hypertension.  WUJ:[id number]:Pickard, Priscille HeidelbergWarren T, MD   Past Medical History:  Diagnosis Date  . Benign essential HTN   . Depression   . Fatty liver disease, nonalcoholic   . H/O: pneumonia    as infant  . HLD (hyperlipidemia)     History reviewed. No pertinent surgical history.  Family History  Problem Relation Age of Onset  . Depression Mother   . Hearing loss Mother   . Hyperlipidemia Mother   . Hypertension Mother   . Miscarriages / [place] Mother   . Diabetes Father   . Hearing loss Father   . Hyperlipidemia Father   . Hypertension Father   . Alcohol abuse Brother   . Depression Brother   . Drug abuse Brother   . Depression Maternal Uncle   . Arthritis Maternal Grandmother   . Cancer Maternal Grandmother   . Depression Maternal Grandmother   . Hearing loss Maternal Grandmother   . Heart disease Maternal Grandmother   . Hypertension Maternal Grandmother   . Depression Maternal Grandfather   . Cancer Paternal Grandmother   . Heart disease Paternal Grandmother   . Alcohol abuse Paternal Grandfather   . Cancer Paternal Grandfather   . Heart disease Paternal Grandfather   . Hyperlipidemia Paternal Grandfather   . Hypertension Paternal Grandfather   . Miscarriages / Stillbirths Paternal Grandfather        retinitis pigmentosis    Social History   Tobacco Use  . Smoking status:  Former Smoker    Types: E-cigarettes    Quit date: 03/31/2014    Years since quitting: 4.9  . Smokeless tobacco: Never Used  Substance Use Topics  . Alcohol use: Yes  . Drug use: No    No current facility-administered medications for this encounter.   Current Outpatient Medications:  .  ibuprofen (ADVIL) 600 MG tablet, Take 1 tablet (600 mg total) by mouth every 6 (six) hours as needed., Disp: 30 tablet, Rfl: 0 .  losartan (COZAAR) 100 MG tablet, TAKE 1 TABLET BY MOUTH EVERY DAY, Disp: 90 tablet, Rfl: 1 .  moxifloxacin (VIGAMOX) 0.5 % ophthalmic solution, Place 1 drop into both eyes 4 (four) times daily. X 7 days, Disp: 3 mL, Rfl: 0 .  Multiple Vitamin (MULTIVITAMIN WITH MINERALS) TABS tablet, Take 1 tablet by mouth daily., Disp: , Rfl:  .  neomycin-polymyxin-hydrocortisone (CORTISPORIN) OTIC solution, Place 4 drops into both ears 4 (four) times daily., Disp: 20 mL, Rfl: 1 .  omeprazole (PRILOSEC) 20 MG capsule, TAKE 1 CAPSULE BY MOUTH EVERY DAY, Disp: 90 capsule, Rfl: 3 .  Polyethyl Glycol-Propyl Glycol (SYSTANE) 0.4-0.3 % SOLN, Apply 1 drop to eye 4 (four) times daily as needed., Disp: 5 mL, Rfl: 0 .  traMADol (ULTRAM) 50 MG tablet, Take 1 tablet (50 mg total) by mouth every 6 (six) hours as needed., Disp: 15 tablet, Rfl: 0 .  TRINTELLIX 10  MG TABS tablet, TAKE 1 TABLET BY MOUTH EVERY DAY, Disp: 30 tablet, Rfl: 5  No Known Allergies   ROS  As noted in HPI.   Physical Exam  BP 114/76 (BP Location: Left Arm)   Pulse 74   Temp 98.2 F (36.8 C) (Oral)   Resp 18   SpO2 97%   Constitutional: Well developed, well nourished, no acute distress Eyes:  EOMI, PERRLA.  No direct or consensual photophobia.  Positive conjunctival injection right eye.  Mild periorbital erythema.  No edema.  Black foreign body embedded in upper right eyelid.  Post removal of foreign body: Large deep corneal abrasion from the 11:00 to the 9 o'clock position.  Negative Seidel.  No corneal foreign  body.   Visual Acuity  Right Eye Distance: 20/40 Left Eye Distance: 20/40 Bilateral Distance:    Right Eye Near:   Left Eye Near:    Bilateral Near:     HENT: Normocephalic, atraumatic,mucus membranes moist Respiratory: Normal inspiratory effort Cardiovascular: Normal rate GI: nondistended skin: No rash, skin intact Musculoskeletal: no deformities Neurologic: Alert & oriented x 3, no focal neuro deficits Psychiatric: Speech and behavior appropriate   ED Course   Medications  Tdap (BOOSTRIX) injection 0.5 mL (0.5 mLs Intramuscular Given 03/20/19 1211)  tetracaine (PONTOCAINE) 0.5 % ophthalmic solution (has no administration in time range)  Tdap (BOOSTRIX) 5-2.5-18.5 LF-MCG/0.5 injection (has no administration in time range)    No orders of the defined types were placed in this encounter.   No results found for this or any previous visit (from the past 24 hour(s)). No results found.  ED Clinical Impression  1. Foreign body of right eyelid   2. Abrasion of right cornea, initial encounter      ED Assessment/Plan  Procedure note.  Anesthetized the right eye with tetracaine.  Attempted to irrigate out the foreign body with sterile saline without success.  Then used a sterile Q-tip and was able to remove it with some difficulty.  Patient tolerated procedure well.  Visual acuity noted.  Patient states it is normally not like this.  Patient with a large, deep corneal abrasion that I suspect is from the foreign body.  The foreign body was removed in its entirety successfully.  contacting ophthalmology for close follow-up tomorrow.  Updating tetanus.  Plan to send home with Vigamox, ibuprofen/Tylenol, Systane  Discussed with Dr. Allyne Gee' tech: Agrees with plan of antibiotic eyedrops 4 times a day, ibuprofen/Tylenol, Systane.  They will see him tomorrow in the office.  Patient is to be there by 9 AM.  He will be seen by Dr. Lelan Pons  Discussed  MDM, treatment plan, and plan for  follow-up with patient. Discussed sn/sx that should prompt return to the ED. patient agrees with plan.   Meds ordered this encounter  Medications  . Tdap (BOOSTRIX) injection 0.5 mL  . Polyethyl Glycol-Propyl Glycol (SYSTANE) 0.4-0.3 % SOLN    Sig: Apply 1 drop to eye 4 (four) times daily as needed.    Dispense:  5 mL    Refill:  0  . moxifloxacin (VIGAMOX) 0.5 % ophthalmic solution    Sig: Place 1 drop into both eyes 4 (four) times daily. X 7 days    Dispense:  3 mL    Refill:  0  . ibuprofen (ADVIL) 600 MG tablet    Sig: Take 1 tablet (600 mg total) by mouth every 6 (six) hours as needed.    Dispense:  30 tablet    Refill:  0    *This clinic note was created using [employer]. Therefore, there may be occasional mistakes despite careful proofreading.   ?    Melynda Ripple, MD 03/20/19 1310

## 2019-04-09 ENCOUNTER — Other Ambulatory Visit: Payer: Self-pay | Admitting: Family Medicine

## 2019-04-11 NOTE — Telephone Encounter (Signed)
Requesting refill    Tramadol  LOV: 12/31/18  LRF:  09/19/18

## 2019-05-30 ENCOUNTER — Other Ambulatory Visit: Payer: Self-pay | Admitting: Family Medicine

## 2019-05-30 NOTE — Telephone Encounter (Signed)
Requesting refill    Tramadol  LOV: 12/31/18  LRF:  04/11/2019

## 2019-06-07 ENCOUNTER — Other Ambulatory Visit: Payer: Self-pay | Admitting: Family Medicine

## 2019-08-05 ENCOUNTER — Other Ambulatory Visit: Payer: Self-pay | Admitting: Family Medicine

## 2019-08-08 NOTE — Telephone Encounter (Signed)
Ok to refill??  Last office visit 12/31/2018.  Last refill 05/31/2019, #1 refills.

## 2019-08-19 ENCOUNTER — Encounter: Payer: Self-pay | Admitting: Family Medicine

## 2019-08-19 ENCOUNTER — Ambulatory Visit (INDEPENDENT_AMBULATORY_CARE_PROVIDER_SITE_OTHER): Payer: BC Managed Care – PPO | Admitting: Family Medicine

## 2019-08-19 ENCOUNTER — Other Ambulatory Visit: Payer: Self-pay

## 2019-08-19 VITALS — BP 118/76 | HR 83 | Temp 97.0°F | Resp 16 | Ht >= 80 in | Wt 330.0 lb

## 2019-08-19 DIAGNOSIS — F329 Major depressive disorder, single episode, unspecified: Secondary | ICD-10-CM

## 2019-08-19 DIAGNOSIS — I10 Essential (primary) hypertension: Secondary | ICD-10-CM

## 2019-08-19 DIAGNOSIS — F32A Depression, unspecified: Secondary | ICD-10-CM

## 2019-08-19 DIAGNOSIS — G8929 Other chronic pain: Secondary | ICD-10-CM | POA: Diagnosis not present

## 2019-08-19 DIAGNOSIS — M545 Low back pain, unspecified: Secondary | ICD-10-CM

## 2019-08-19 NOTE — Progress Notes (Signed)
Subjective:    Patient ID: Roberto [name], male    DOB: [birth year], 29 y.o.   MRN: [id number]  HPI  Patient has questions regarding his Worker's Comp.  He injured his lower back in 2017.  He states that his [employer]. 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  He has questions regarding whether he should take this or not.  Per his report his MRI, nerve conduction studies, platelet rich plasma injections, RFA treatments, have provided him minimal improvement in his lower back.  He also occasionally has muscle spasms in his upper thoracic spine.  I explained to the patient that I do not believe his lower back injury would cause the muscle spasms in his thoracic spine.  I believe this is more likely due to posture and activity.  At the present time he states that the pain in his lower back has improved and he is no longer having neuropathic pain radiating down his legs.  I encouraged the patient to discuss his situation with his attorney however I do not feel that his previous injury from 2017 is causing the pain in his upper back.  He would like to wean off his Trintellix.  He is doing well regarding his depression.  He denies any depression, anhedonia, suicidal thoughts, or insomnia.  He is currently taking 10 mg a day.  He is also on losartan 100 mg a day for hypertension.  He is overdue for fasting lab work. Past Medical History:  Diagnosis Date  . Benign essential HTN   . Depression   . Fatty liver disease, nonalcoholic   . H/O: pneumonia    as infant  . HLD (hyperlipidemia)    No past surgical history on file. Current Outpatient Medications on File Prior to Visit  Medication Sig Dispense Refill  . losartan (COZAAR) 100 MG tablet TAKE 1 TABLET BY MOUTH EVERY DAY 90 tablet 1  . omeprazole (PRILOSEC) 20 MG capsule TAKE 1 CAPSULE BY MOUTH EVERY DAY 90 capsule 3  . traMADol (ULTRAM) 50 MG tablet TAKE 1 TABLET BY MOUTH EVERY 8 HOURS AS NEEDED 21 tablet 1  . TRINTELLIX 10 MG TABS tablet TAKE 1 TABLET  BY MOUTH EVERY DAY 30 tablet 5   No current facility-administered medications on file prior to visit.   No Known Allergies Social History   Socioeconomic History  . Marital status: Married    Spouse name: Not on file  . Number of children: Not on file  . Years of education: Not on file  . Highest education level: Not on file  Occupational History  . Not on file  Tobacco Use  . Smoking status: Former Smoker    Types: E-cigarettes    Quit date: 03/31/2014    Years since quitting: 5.3  . Smokeless tobacco: Never Used  Substance and Sexual Activity  . Alcohol use: Yes  . Drug use: No  . Sexual activity: Never    Birth control/protection: Condom  Other Topics Concern  . Not on file  Social History Narrative   Works Office manager.    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 Difficulty of Paying Living Expenses:   Food Insecurity:   . Worried About Programme researcher, broadcasting/film/video in the Last Year:   . Barista in the Last Year:   Transportation Needs:   . Freight forwarder (Medical):   Marland Kitchen Lack of  Transportation (Non-Medical):   Physical Activity:   . Days of Exercise per Week:   . Minutes of Exercise per Session:   Stress:   . Feeling of Stress :   Social Connections:   . Frequency of Communication with Friends and Family:   . Frequency of Social Gatherings with Friends and Family:   . Attends Religious Services:   . Active Member of Clubs or Organizations:   . Attends Archivist Meetings:   Marland Kitchen Marital Status:   Intimate Partner Violence:   . Fear of Current or Ex-Partner:   . Emotionally Abused:   Marland Kitchen Physically Abused:   . Sexually Abused:      Review of Systems  All other systems reviewed and are negative.      Objective:   Physical Exam  Cardiovascular: Normal rate, regular rhythm and normal heart sounds.  No murmur heard. Pulmonary/Chest: Effort normal and  breath sounds normal. No respiratory distress. He has no wheezes. He has no rales.  Abdominal: Soft. Bowel sounds are normal. He exhibits no distension. There is no abdominal tenderness. There is no rebound.  Vitals reviewed.         Assessment & Plan:  Chronic midline low back pain without sciatica  Depression, unspecified depression type  Benign essential HTN  Blood pressure today is outstanding.  Check CBC, CMP, fasting lipid panel.  Recommended decreasing Trintellix to 5 mg a day and then reassessing via email in 2 to 3 weeks.  He is tolerating the lower dose I would decrease to every other day and slowly wean the patient off the medication.  Regarding his back pain, I do not feel that his thoracic muscle spasms are due to his low back injury.  I would defer his legal question to his attorney as well as his Worker's Comp. physician.

## 2019-08-23 ENCOUNTER — Other Ambulatory Visit: Payer: Self-pay

## 2019-08-23 ENCOUNTER — Other Ambulatory Visit: Payer: BC Managed Care – PPO

## 2019-08-24 LAB — LIPID PANEL
Cholesterol: 204 mg/dL — ABNORMAL HIGH (ref <200)
HDL: 36 mg/dL — ABNORMAL LOW (ref >=40)
LDL Cholesterol (Calc): 147 mg/dL (calc) — ABNORMAL HIGH
Non-HDL Cholesterol (Calc): 168 mg/dL (calc) — ABNORMAL HIGH (ref <130)
Total CHOL/HDL Ratio: 5.7 (calc) — ABNORMAL HIGH (ref <5.0)
Triglycerides: 99 mg/dL (ref <150)

## 2019-08-24 LAB — CBC WITH DIFFERENTIAL/PLATELET
Absolute Monocytes: 350 cells/uL (ref 200–950)
Basophils Absolute: 40 cells/uL (ref 0–200)
Basophils Relative: 0.8 %
Eosinophils Absolute: 110 cells/uL (ref 15–500)
Eosinophils Relative: 2.2 %
HCT: 46.2 % (ref 38.5–50.0)
Hemoglobin: 15 g/dL (ref 13.2–17.1)
Lymphs Abs: 1835 cells/uL (ref 850–3900)
MCH: 28.1 pg (ref 27.0–33.0)
MCHC: 32.5 g/dL (ref 32.0–36.0)
MCV: 86.5 fL (ref 80.0–100.0)
MPV: 10 fL (ref 7.5–12.5)
Monocytes Relative: 7 %
Neutro Abs: 2665 cells/uL (ref 1500–7800)
Neutrophils Relative %: 53.3 %
Platelets: 230 10*3/uL (ref 140–400)
RBC: 5.34 10*6/uL (ref 4.20–5.80)
RDW: 13.2 % (ref 11.0–15.0)
Total Lymphocyte: 36.7 %
WBC: 5 10*3/uL (ref 3.8–10.8)

## 2019-08-24 LAB — COMPLETE METABOLIC PANEL WITH GFR
AG Ratio: 1.8 (calc) (ref 1.0–2.5)
ALT: 23 U/L (ref 9–46)
AST: 18 U/L (ref 10–40)
Albumin: 4.2 g/dL (ref 3.6–5.1)
Alkaline phosphatase (APISO): 67 U/L (ref 36–130)
BUN: 25 mg/dL (ref 7–25)
CO2: 25 mmol/L (ref 20–32)
Calcium: 9.8 mg/dL (ref 8.6–10.3)
Chloride: 112 mmol/L — ABNORMAL HIGH (ref 98–110)
Creat: 1.02 mg/dL (ref 0.60–1.35)
GFR, Est African American: 114 mL/min/{1.73_m2} (ref >=60)
GFR, Est Non African American: 98 mL/min/{1.73_m2} (ref >=60)
Globulin: 2.3 g/dL (calc) (ref 1.9–3.7)
Glucose, Bld: 91 mg/dL (ref 65–99)
Potassium: 4.4 mmol/L (ref 3.5–5.3)
Sodium: 143 mmol/L (ref 135–146)
Total Bilirubin: 0.4 mg/dL (ref 0.2–1.2)
Total Protein: 6.5 g/dL (ref 6.1–8.1)

## 2019-08-25 ENCOUNTER — Encounter: Payer: Self-pay | Admitting: Family Medicine

## 2019-08-25 ENCOUNTER — Other Ambulatory Visit: Payer: Self-pay | Admitting: Family Medicine

## 2019-08-25 MED ORDER — ATORVASTATIN CALCIUM 20 MG PO TABS: 20.0000 mg | ORAL_TABLET | Freq: Every day | ORAL | 1 refills | Status: DC

## 2019-08-29 ENCOUNTER — Other Ambulatory Visit: Payer: Self-pay | Admitting: Family Medicine

## 2019-09-12 ENCOUNTER — Other Ambulatory Visit: Payer: Self-pay | Admitting: Family Medicine

## 2019-09-12 NOTE — Telephone Encounter (Signed)
Ok to refill??  Last office visit 08/19/2019.  Last refill 08/08/2019.

## 2019-09-12 NOTE — Telephone Encounter (Signed)
Requested Prescriptions   Pending Prescriptions Disp Refills  . traMADol (ULTRAM) 50 MG tablet [Pharmacy Med Name: TRAMADOL HCL 50 MG TABLET] 21 tablet 1    Sig: TAKE 1 TABLET BY MOUTH EVERY 8 HOURS AS NEEDED    Last OV 08/19/2019   Last written 08/08/2019

## 2019-09-26 ENCOUNTER — Encounter: Payer: Self-pay | Admitting: Family Medicine

## 2019-10-08 ENCOUNTER — Encounter: Payer: Self-pay | Admitting: Family Medicine

## 2019-10-10 NOTE — Telephone Encounter (Signed)
   It is normal to have more mood swings coming off anti-depressants, however, reviewing his chart he did wean down off the meds which should lessen the affects.  If it has been a week and still experiencing mood swings, he would benefit from going back on the medication at a low dose  Trintillex 5mg  once a day and discussing with Dr. an alternative

## 2019-10-12 NOTE — Telephone Encounter (Signed)
Gave pt advice left by K. Jeanice Lim, MD

## 2019-10-14 ENCOUNTER — Encounter: Payer: Self-pay | Admitting: Family Medicine

## 2019-10-29 IMAGING — CT CT RENAL STONE PROTOCOL
2 of 4 series · 17 of 46 positions shown, 19 images · non-contrast
Comparison: None.

CLINICAL DATA: Right flank pain.

EXAM:
CT ABDOMEN AND PELVIS WITHOUT CONTRAST
TECHNIQUE: Multidetector CT imaging of the abdomen and pelvis was performed
following the standard protocol without IV contrast.

[Series 3: renal stone 5.0 · axial · 0.91mm/px · z∈[+701,+1176]mm · 14 of 105 slices shown, 16 images]
[im 5/105  soft-tissue]
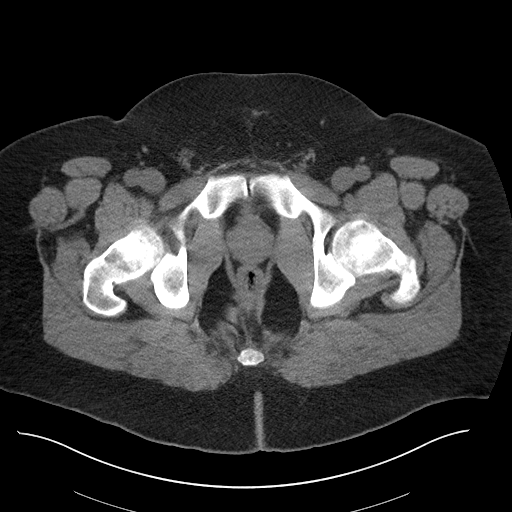
[im 5/105  bone]
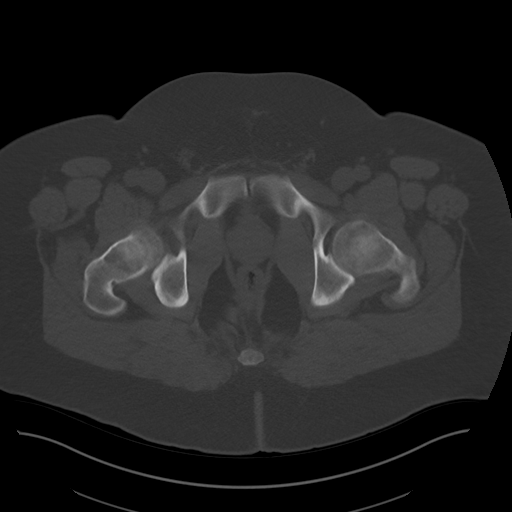
[im 15/105  soft-tissue]
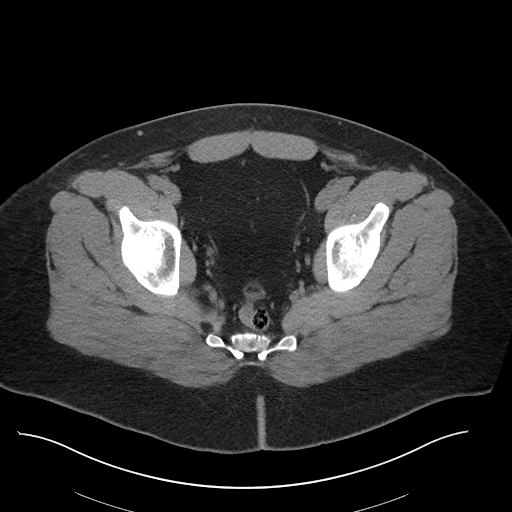
[im 19/105  soft-tissue]
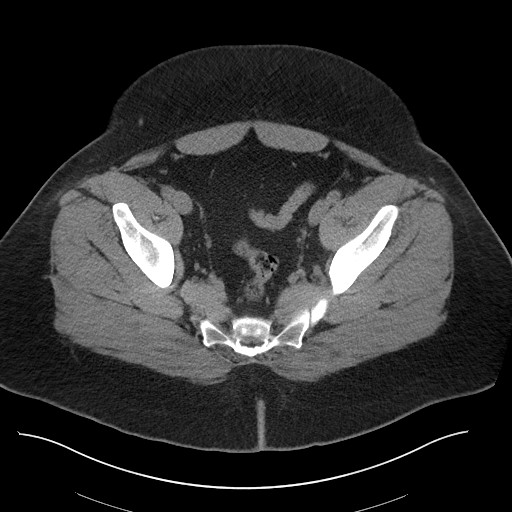
[im 29/105  soft-tissue]
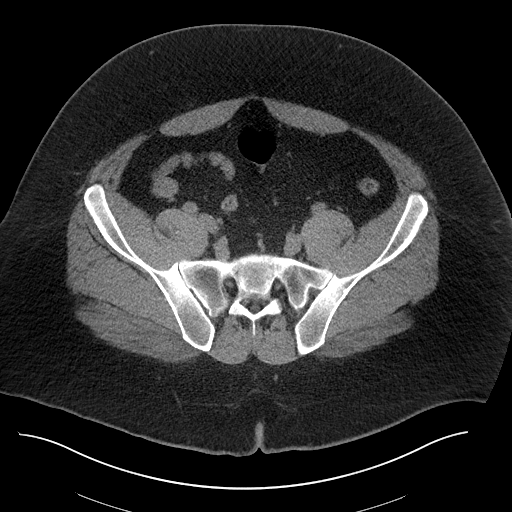
[im 34/105  soft-tissue]
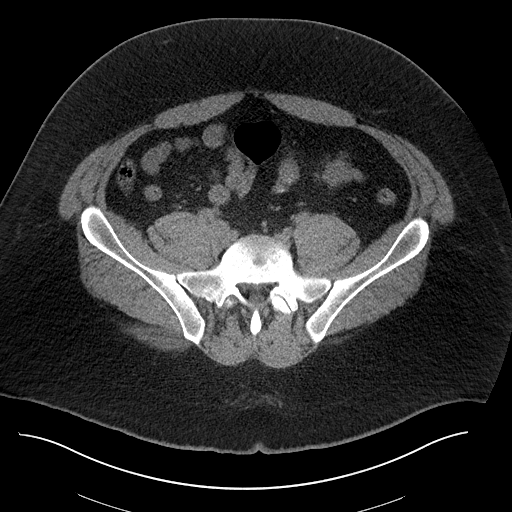
[im 43/105  soft-tissue]
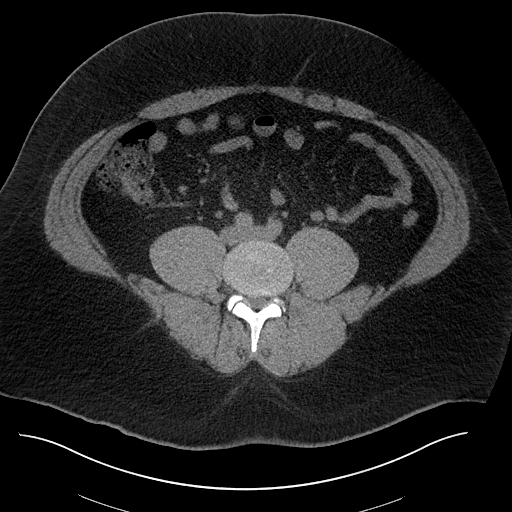
[im 48/105  soft-tissue]
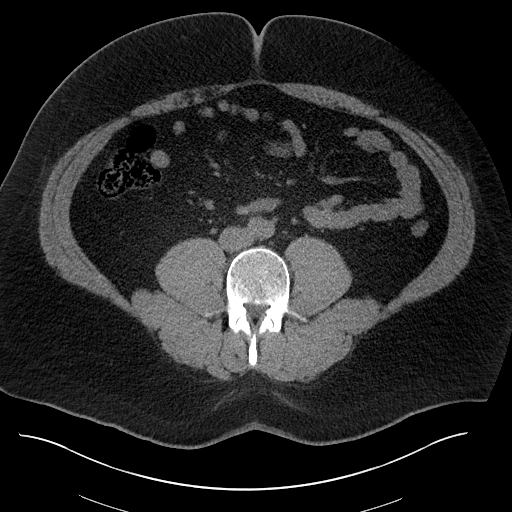
[im 57/105  soft-tissue]
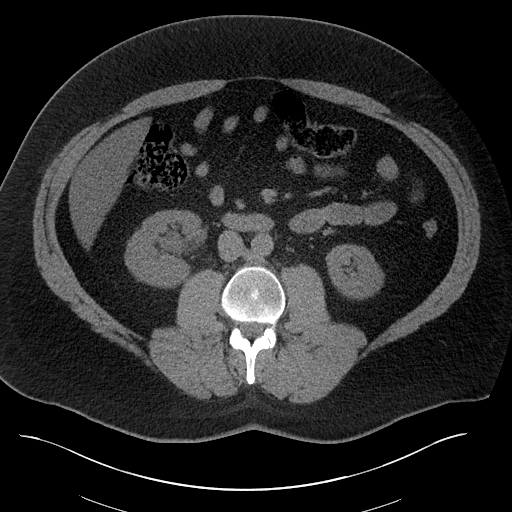
[im 62/105  soft-tissue]
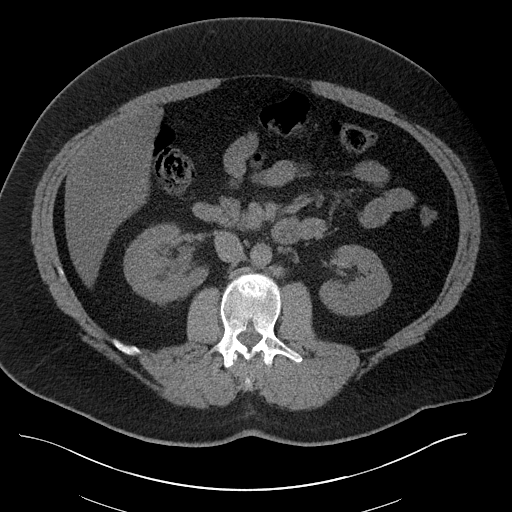
[im 62/105  bone]
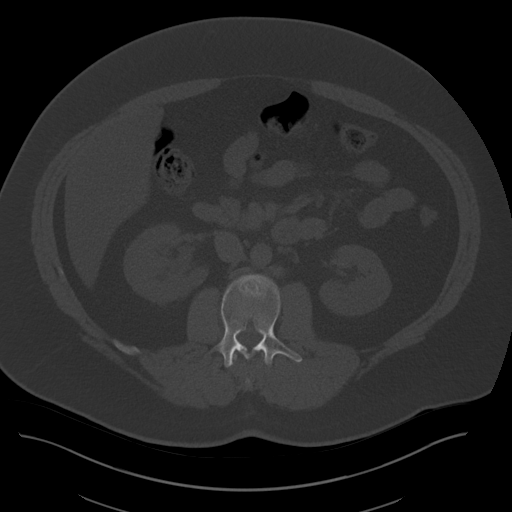
[im 71/105  soft-tissue]
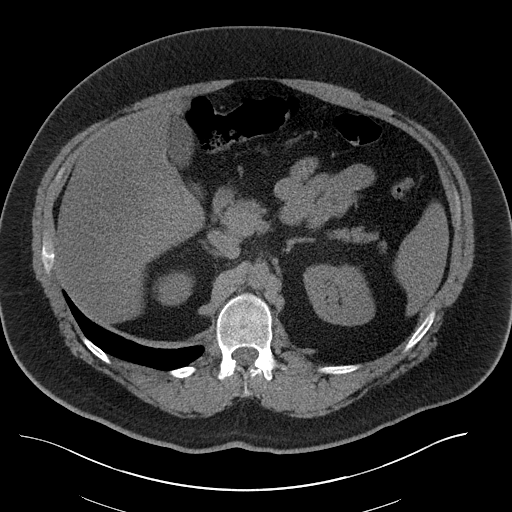
[im 76/105  soft-tissue]
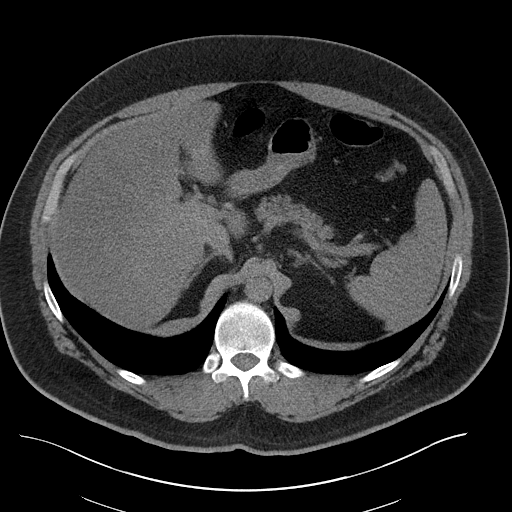
[im 86/105  soft-tissue]
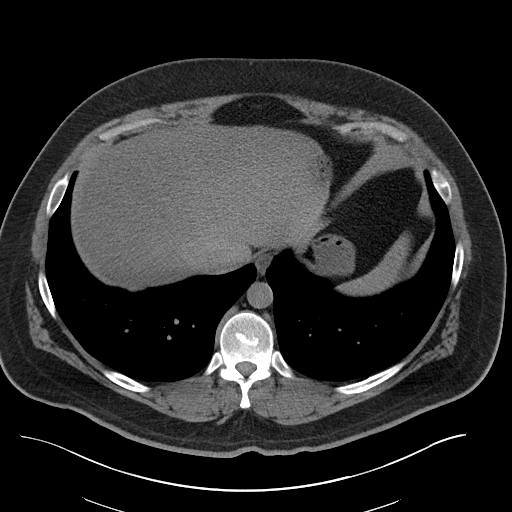
[im 90/105  soft-tissue]
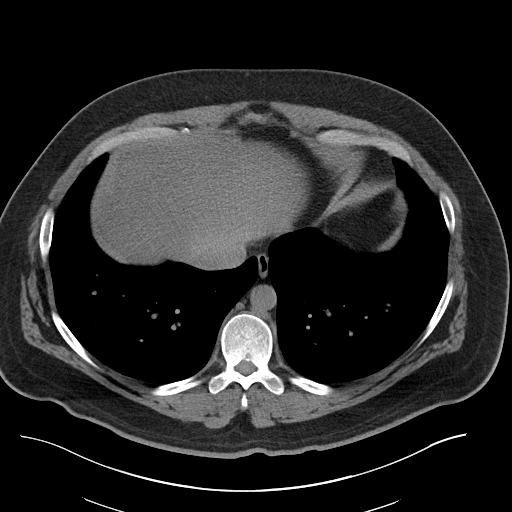
[im 100/105  soft-tissue]
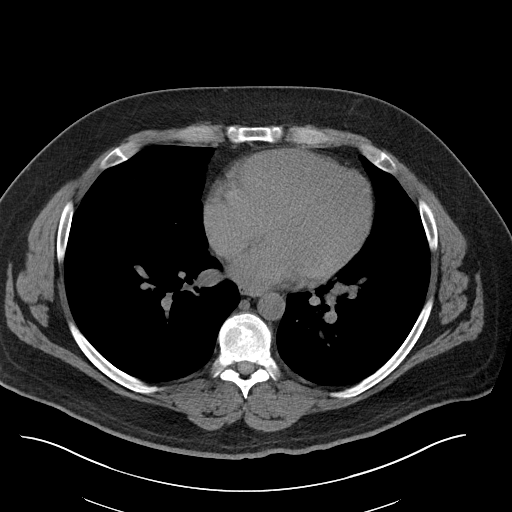

[Series 6: renal stone 3.0 cor · coronal · 1.11mm/px · 3 of 134 slices shown]
[im 45/134  soft-tissue]
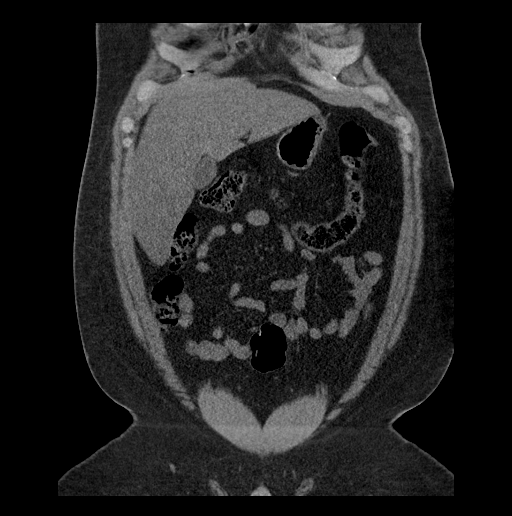
[im 60/134  soft-tissue]
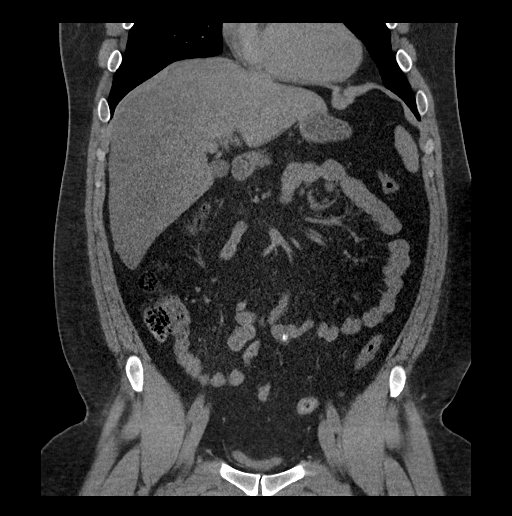
[im 74/134  soft-tissue]
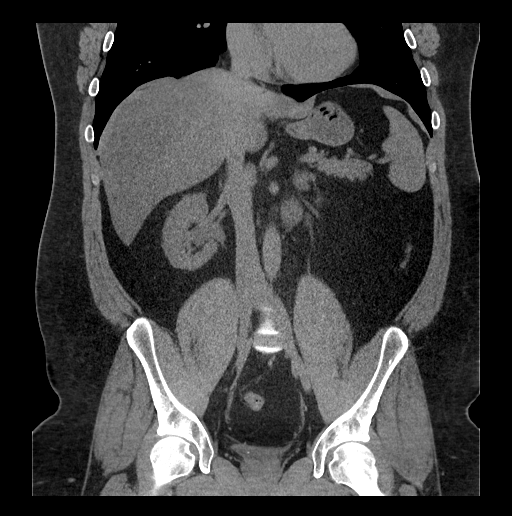

[17 of 46 positions shown; findings below may reference images not displayed]

FINDINGS: Lower chest: No acute abnormality.

Hepatobiliary: Hepatic steatosis. The liver and gallbladder are
otherwise normal.

Pancreas: Unremarkable. No pancreatic ductal dilatation or
surrounding inflammatory changes.

Spleen: Normal in size without focal abnormality.

Adrenals/Urinary Tract: No renal stones identified. Mild
hydronephrosis identified on the left with mild prominence of the
left ureter. There is a 3.8 mm calcification in the right posterior
bladder, likely recently passed stone accounting for the right
hydronephrosis. No left hydronephrosis or ureterectasis. The bladder
is otherwise unremarkable.

Stomach/Bowel: Stomach is within normal limits. Appendix appears
normal. No evidence of bowel wall thickening, distention, or
inflammatory changes.

Vascular/Lymphatic: No significant vascular findings are present. No
enlarged abdominal or pelvic lymph nodes.

Reproductive: Prostate is unremarkable.

Other: No abdominal wall hernia or abnormality. No abdominopelvic
ascites.

Musculoskeletal: No acute or significant osseous findings.
IMPRESSION: 1. There is a 3.8 mm stone in the right posterior bladder,
consistent with a recently passed stone explaining the mild right
hydronephrosis and ureterectasis.
2. Hepatic steatosis.

## 2019-11-10 ENCOUNTER — Other Ambulatory Visit: Payer: Self-pay | Admitting: Family Medicine

## 2019-11-11 ENCOUNTER — Other Ambulatory Visit: Payer: Self-pay | Admitting: Family Medicine

## 2019-11-11 NOTE — Telephone Encounter (Signed)
Ok to refill??  Last office visit 08/19/2019.  Last refill 09/13/2019,  #1 refills.

## 2019-12-01 ENCOUNTER — Other Ambulatory Visit: Payer: Self-pay | Admitting: Family Medicine

## 2020-01-05 ENCOUNTER — Other Ambulatory Visit: Payer: Self-pay | Admitting: Family Medicine

## 2020-01-05 NOTE — Telephone Encounter (Signed)
Ok to refill??  Last office visit 08/19/2019.  Last refill 11/11/2019.

## 2020-03-12 ENCOUNTER — Other Ambulatory Visit: Payer: Self-pay

## 2020-03-12 ENCOUNTER — Ambulatory Visit (INDEPENDENT_AMBULATORY_CARE_PROVIDER_SITE_OTHER): Payer: BC Managed Care – PPO | Admitting: Family Medicine

## 2020-03-12 NOTE — Progress Notes (Signed)
Subjective:    Patient ID: Roberto [name], male    DOB: 1990-12-10, 29 y.o.   MRN: [id number]  4/21  Patient has questions regarding his Worker's Comp.  He injured his lower back in 2017.  He states that his [employer]. 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  He has questions regarding whether he should take this or not.  Per his report his MRI, nerve conduction studies, platelet rich plasma injections, RFA treatments, have provided him minimal improvement in his lower back.  He also occasionally has muscle spasms in his upper thoracic spine.  I explained to the patient that I do not believe his lower back injury would cause the muscle spasms in his thoracic spine.  I believe this is more likely due to posture and activity.  At the present time he states that the pain in his lower back has improved and he is no longer having neuropathic pain radiating down his legs.  I encouraged the patient to discuss his situation with his attorney however I do not feel that his previous injury from 2017 is causing the pain in his upper back.  He would like to wean off his Trintellix.  He is doing well regarding his depression.  He denies any depression, anhedonia, suicidal thoughts, or insomnia.  He is currently taking 10 mg a day.  He is also on losartan 100 mg a day for hypertension.  He is overdue for fasting lab work.  At that time, my plan was: Blood pressure today is outstanding.  Check CBC, CMP, fasting lipid panel.  Recommended decreasing Trintellix to 5 mg a day and then reassessing via email in 2 to 3 weeks.  He is tolerating the lower dose I would decrease to every other day and slowly wean the patient off the medication.  Regarding his back pain, I do not feel that his thoracic muscle spasms are due to his low back injury.  I would defer his legal question to his attorney as well as his Worker's Comp. Physician.  03/12/20  Past Medical History:  Diagnosis Date  . Benign essential HTN   . Depression   .  Fatty liver disease, nonalcoholic   . H/O: pneumonia    as infant  . HLD (hyperlipidemia)    No past surgical history on file. Current Outpatient Medications on File Prior to Visit  Medication Sig Dispense Refill  . atorvastatin (LIPITOR) 20 MG tablet Take 1 tablet (20 mg total) by mouth daily. 90 tablet 1  . losartan (COZAAR) 100 MG tablet TAKE 1 TABLET BY MOUTH EVERY DAY 90 tablet 1  . omeprazole (PRILOSEC) 20 MG capsule TAKE 1 CAPSULE BY MOUTH EVERY DAY 90 capsule 3  . traMADol (ULTRAM) 50 MG tablet TAKE 1 TABLET BY MOUTH EVERY 8 HOURS AS NEEDED 21 tablet 1  . TRINTELLIX 10 MG TABS tablet TAKE 1 TABLET BY MOUTH EVERY DAY 30 tablet 5   No current facility-administered medications on file prior to visit.   No Known Allergies Social History   Socioeconomic History  . Marital status: Married    Spouse name: Not on file  . Number of children: Not on file  . Years of education: Not on file  . Highest education level: Not on file  Occupational History  . Not on file  Tobacco Use  . Smoking status: Former Smoker    Types: E-cigarettes    Quit date: 03/31/2014    Years since quitting: 5.9  . Smokeless tobacco: Never  Used  Substance and Sexual Activity  . Alcohol use: Yes  . Drug use: No  . Sexual activity: Never    Birth control/protection: Condom  Other Topics Concern  . Not on file  Social History Narrative   Works Office manager.    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 Difficulty of Paying Living Expenses: Not on file  Food Insecurity:   . Worried About Programme researcher, broadcasting/film/video in the Last Year: Not on file  . Ran Out of Food in the Last Year: Not on file  Transportation Needs:   . Lack of Transportation (Medical): Not on file  . Lack of Transportation (Non-Medical): Not on file  Physical Activity:   . Days of Exercise per Week: Not on file  . Minutes of Exercise per Session: Not on  file  Stress:   . Feeling of Stress : Not on file  Social Connections:   . Frequency of Communication with Friends and Family: Not on file  . Frequency of Social Gatherings with Friends and Family: Not on file  . Attends Religious Services: Not on file  . Active Member of Clubs or Organizations: Not on file  . Attends Banker Meetings: Not on file  . Marital Status: Not on file  Intimate Partner Violence:   . Fear of Current or Ex-Partner: Not on file  . Emotionally Abused: Not on file  . Physically Abused: Not on file  . Sexually Abused: Not on file     Review of Systems  All other systems reviewed and are negative.      Objective:   Physical Exam Vitals reviewed.  Cardiovascular:     Rate and Rhythm: Normal rate and regular rhythm.     Heart sounds: Normal heart sounds. No murmur heard.   Pulmonary:     Effort: Pulmonary effort is normal. No respiratory distress.     Breath sounds: Normal breath sounds. No wheezing or rales.  Abdominal:     General: Bowel sounds are normal. There is no distension.     Palpations: Abdomen is soft.     Tenderness: There is no abdominal tenderness. There is no rebound.           Assessment & Plan:

## 2020-04-10 ENCOUNTER — Encounter: Payer: Self-pay | Admitting: Family Medicine

## 2020-04-14 ENCOUNTER — Other Ambulatory Visit: Payer: Self-pay | Admitting: Family Medicine

## 2020-04-15 ENCOUNTER — Other Ambulatory Visit: Payer: Self-pay | Admitting: Family Medicine

## 2020-04-17 NOTE — Telephone Encounter (Signed)
Ok to refill??  Last office visit 4/616/2021.  Last refill 01/06/2020, #1 refill.

## 2020-04-25 ENCOUNTER — Encounter: Payer: Self-pay | Admitting: Family Medicine

## 2020-04-30 MED ORDER — OMEPRAZOLE 20 MG PO CPDR: DELAYED_RELEASE_CAPSULE | ORAL | 1 refills | Status: DC

## 2020-05-08 ENCOUNTER — Telehealth: Payer: Self-pay | Admitting: Family Medicine

## 2020-05-08 NOTE — Telephone Encounter (Signed)
Patient needs a PA on his TRINTELLIX 10 MG TABS tablet    CB# [id number]

## 2020-05-08 NOTE — Telephone Encounter (Signed)
Samples given until jPA can be completed.

## 2020-05-14 ENCOUNTER — Other Ambulatory Visit: Payer: Self-pay

## 2020-05-14 ENCOUNTER — Encounter: Payer: Self-pay | Admitting: Family Medicine

## 2020-05-14 ENCOUNTER — Ambulatory Visit (INDEPENDENT_AMBULATORY_CARE_PROVIDER_SITE_OTHER): Payer: 59 | Admitting: Family Medicine

## 2020-05-14 ENCOUNTER — Other Ambulatory Visit: Payer: Self-pay | Admitting: *Deleted

## 2020-05-14 VITALS — HR 88 | Resp 18

## 2020-05-14 DIAGNOSIS — B9689 Other specified bacterial agents as the cause of diseases classified elsewhere: Secondary | ICD-10-CM | POA: Diagnosis not present

## 2020-05-14 DIAGNOSIS — J019 Acute sinusitis, unspecified: Secondary | ICD-10-CM | POA: Diagnosis not present

## 2020-05-14 DIAGNOSIS — R509 Fever, unspecified: Secondary | ICD-10-CM

## 2020-05-14 MED ORDER — AMOXICILLIN-POT CLAVULANATE 875-125 MG PO TABS: 1.0000 | ORAL_TABLET | Freq: Two times a day (BID) | ORAL | 0 refills | Status: DC

## 2020-05-14 NOTE — Telephone Encounter (Signed)
Received request from pharmacy for PA on Trintellix.   PA submitted.   Dx: F32.9- MDD.  Your PA has been faxed to the plan as a paper copy. Please contact the plan directly if you haven't received a determination in a typical timeframe.  You will be notified of the determination via fax.

## 2020-05-14 NOTE — Progress Notes (Signed)
Subjective:    Patient ID: Roberto [name], male    DOB: December 07, 1990, 30 y.o.   MRN: [id number]  HPI  Patient was seen today in our parking lot.  He states that starting the Monday after Christmas which would have been 27 December he developed a head cold.  He reports pain and pressure behind his eyes, in his cheekbones, and in his face.  He reports times of drainage going down the back of his throat and a constant runny nose.  The drainage going down his throat is triggering a coughing spell.  He states that he is coughing often.  Pulse oximetry today is 93% on room air however he denies any chest pain shortness of breath or dyspnea on exertion.  His lungs are clear to auscultation bilaterally.  There is no wheezes or crackles appreciated.  He has had 2 home COVID test that are negative.  His wife was tested with a PCR test that was negative.  His biggest concern is the sinus pressure and pain in his face and the drainage going down his throat  Past Medical History:  Diagnosis Date  . Benign essential HTN   . Depression   . Fatty liver disease, nonalcoholic   . H/O: pneumonia    as infant  . HLD (hyperlipidemia)    No past surgical history on file. Current Outpatient Medications on File Prior to Visit  Medication Sig Dispense Refill  . losartan (COZAAR) 100 MG tablet TAKE 1 TABLET BY MOUTH EVERY DAY 90 tablet 1  . omeprazole (PRILOSEC) 20 MG capsule TAKE 1 CAPSULE BY MOUTH EVERY DAY 30 capsule 1  . traMADol (ULTRAM) 50 MG tablet TAKE 1 TABLET BY MOUTH EVERY 8 HOURS AS NEEDED 21 tablet 1  . TRINTELLIX 10 MG TABS tablet TAKE 1 TABLET BY MOUTH EVERY DAY 30 tablet 5   No current facility-administered medications on file prior to visit.   No Known Allergies Social History   Socioeconomic History  . Marital status: Married    Spouse name: Not on file  . Number of children: Not on file  . Years of education: Not on file  . Highest education level: Not on file  Occupational History  . Not  on file  Tobacco Use  . Smoking status: Former Smoker    Types: E-cigarettes    Quit date: 03/31/2014    Years since quitting: 6.1  . Smokeless tobacco: Never Used  Substance and Sexual Activity  . Alcohol use: Yes  . Drug use: No  . Sexual activity: Never    Birth control/protection: Condom  Other Topics Concern  . Not on file  Social History Narrative   Works Office manager.    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      Objective:   Physical Exam Constitutional:      General: He is not in acute distress.    Appearance: Normal appearance. He is not ill-appearing or toxic-appearing.  HENT:     Nose: Congestion and rhinorrhea present.  Cardiovascular:     Rate and Rhythm: Normal rate and regular rhythm.  Heart sounds: Normal heart sounds.  Pulmonary:     Effort: No respiratory distress.     Breath sounds: No wheezing, rhonchi or rales.  Neurological:     Mental Status: He is alert.           Assessment & Plan:  Fever, unspecified fever cause - Plan: SARS-COV-2 RNA,(COVID-19) QUAL NAAT  Acute bacterial rhinosinusitis  Symptoms have been present for almost 2 weeks or more.  2 COVID test have been negative.  I believe the patient has a sinus infection.  Begin Augmentin 875 mg twice daily for 10 days.  I am concerned by his pulse oximetry so I did test a COVID test.  If he gets short of breath he is to go to the emergency room immediately

## 2020-05-17 LAB — SARS-COV-2 RNA,(COVID-19) QUALITATIVE NAAT: SARS CoV2 RNA: NOT DETECTED

## 2020-05-17 MED ORDER — VORTIOXETINE HBR 10 MG PO TABS: 10.0000 mg | ORAL_TABLET | Freq: Every day | ORAL | 5 refills | Status: DC

## 2020-05-17 NOTE — Telephone Encounter (Signed)
Received PA determination.   PA approved 05/16/2020- 05/15/2021.  Pharmacy made aware.

## 2020-05-31 ENCOUNTER — Other Ambulatory Visit: Payer: Self-pay | Admitting: Family Medicine

## 2020-06-03 ENCOUNTER — Encounter: Payer: Self-pay | Admitting: Family Medicine

## 2020-06-04 ENCOUNTER — Other Ambulatory Visit: Payer: Self-pay | Admitting: Family Medicine

## 2020-06-04 MED ORDER — ESCITALOPRAM OXALATE 10 MG PO TABS: 10.0000 mg | ORAL_TABLET | Freq: Every day | ORAL | 5 refills | Status: DC

## 2020-06-26 ENCOUNTER — Other Ambulatory Visit: Payer: Self-pay | Admitting: Family Medicine

## 2020-07-19 ENCOUNTER — Other Ambulatory Visit: Payer: Self-pay | Admitting: Family Medicine

## 2020-07-30 ENCOUNTER — Other Ambulatory Visit: Payer: Self-pay | Admitting: Family Medicine

## 2020-07-31 NOTE — Telephone Encounter (Signed)
Ok to refill??  Last office visit 05/14/2020  Last refill 04/17/2020, #1 refill.

## 2020-10-27 ENCOUNTER — Other Ambulatory Visit: Payer: Self-pay | Admitting: Family Medicine

## 2020-10-29 NOTE — Telephone Encounter (Signed)
Ok to refill??  Last office visit 05/14/2020.  Last refill 07/31/2020, #1 refill.

## 2020-10-30 NOTE — Telephone Encounter (Signed)
PDMP reviewed.  Appears patient takes this medication chronically.  Refill given in place of PCP who is out of the office. 

## 2020-11-26 ENCOUNTER — Other Ambulatory Visit: Payer: Self-pay | Admitting: Nurse Practitioner

## 2020-11-26 NOTE — Telephone Encounter (Signed)
Ok to refill??  Last office visit 05/14/2020.  Last refill 10/30/2020.

## 2020-12-01 ENCOUNTER — Other Ambulatory Visit: Payer: Self-pay | Admitting: Family Medicine

## 2021-03-14 ENCOUNTER — Other Ambulatory Visit: Payer: Self-pay | Admitting: Family Medicine

## 2021-04-03 ENCOUNTER — Other Ambulatory Visit: Payer: Self-pay | Admitting: Family Medicine

## 2021-04-04 NOTE — Telephone Encounter (Signed)
LOV routine 08/19/19 Last refill 11/26/20, #21, 0 refills  Please review, thanks!

## 2021-04-12 ENCOUNTER — Other Ambulatory Visit: Payer: Self-pay | Admitting: Family Medicine

## 2021-04-16 ENCOUNTER — Ambulatory Visit (INDEPENDENT_AMBULATORY_CARE_PROVIDER_SITE_OTHER): Payer: 59 | Admitting: Family Medicine

## 2021-04-16 ENCOUNTER — Other Ambulatory Visit: Payer: Self-pay

## 2021-04-16 VITALS — BP 112/60 | HR 52 | Temp 98.0°F | Ht >= 80 in | Wt 315.0 lb

## 2021-04-16 DIAGNOSIS — I10 Essential (primary) hypertension: Secondary | ICD-10-CM

## 2021-04-16 NOTE — Progress Notes (Signed)
Subjective:    Patient ID: Roberto [name], male    DOB: 10/14/1990, 30 y.o.   MRN: [id number]  HPI Patient is a very pleasant 30 year old Caucasian gentleman here today to get a medical form completed to become a foster parent.  Both he and his wife are scheduled to adopt an 32-year-old girl.  He denies any symptoms of tuberculosis.  Specifically denies any hemoptysis, fevers, chills, night sweats.  His blood pressure today is well controlled.  There is no history of severe mental illness or ongoing substance abuse.  I have no concerns regarding his ability to function as a foster parent.  I congratulated the patient on his recent weight loss.  He has been working very hard at this and has done quite well. Past Medical History:  Diagnosis Date   Benign essential HTN    Depression    Fatty liver disease, nonalcoholic    H/O: pneumonia    as infant   HLD (hyperlipidemia)    No past surgical history on file. Current Outpatient Medications on File Prior to Visit  Medication Sig Dispense Refill   losartan (COZAAR) 100 MG tablet TAKE 1 TABLET BY MOUTH EVERY DAY 90 tablet 1   omeprazole (PRILOSEC) 20 MG capsule TAKE 1 CAPSULE BY MOUTH EVERY DAY 30 capsule 1   traMADol (ULTRAM) 50 MG tablet TAKE 1 TABLET BY MOUTH EVERY 8 HOURS AS NEEDED 21 tablet 0   escitalopram (LEXAPRO) 10 MG tablet TAKE 1 TABLET BY MOUTH EVERY DAY (Patient not taking: Reported on 04/16/2021) 90 tablet 2   No current facility-administered medications on file prior to visit.   No Known Allergies Social History   Socioeconomic History   Marital status: Married    Spouse name: Not on file   Number of children: Not on file   Years of education: Not on file   Highest education level: Not on file  Occupational History   Not on file  Tobacco Use   Smoking status: Former    Types: E-cigarettes    Quit date: 03/31/2014    Years since quitting: 7.0   Smokeless tobacco: Never  Substance and Sexual Activity   Alcohol use: Yes    Drug use: No   Sexual activity: Never    Birth control/protection: Condom  Other Topics Concern   Not on file  Social History Narrative   Works Office manager.    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     Objective:   Physical Exam Vitals reviewed.  Cardiovascular:     Rate and Rhythm: Normal rate and regular rhythm.     Heart sounds: Normal heart sounds. No murmur heard. Pulmonary:     Effort: Pulmonary effort is normal. No respiratory distress.     Breath sounds: Normal breath sounds. No wheezing or rales.  Abdominal:     General: Bowel sounds are normal. There is no distension.     Palpations: Abdomen is soft.     Tenderness: There is no abdominal tenderness. There is no rebound.          Assessment & Plan:  Benign essential HTN - Plan: BASIC METABOLIC PANEL WITH GFR, Lipid panel I have asked the patient to return fasting for a CMP and a fasting lipid panel.  His blood pressure today is outstanding.  Recommended a flu shot which she declined.  Recommended a booster on the COVID shot but he has not had the original series yet and he declines any COVID vaccinations.

## 2021-04-19 ENCOUNTER — Other Ambulatory Visit: Payer: 59

## 2021-04-19 ENCOUNTER — Other Ambulatory Visit: Payer: Self-pay

## 2021-04-20 LAB — COMPLETE METABOLIC PANEL WITH GFR
AG Ratio: 2.3 (calc) (ref 1.0–2.5)
ALT: 21 U/L (ref 9–46)
AST: 23 U/L (ref 10–40)
Albumin: 4.1 g/dL (ref 3.6–5.1)
Alkaline phosphatase (APISO): 38 U/L (ref 36–130)
BUN: 20 mg/dL (ref 7–25)
CO2: 24 mmol/L (ref 20–32)
Calcium: 8.8 mg/dL (ref 8.6–10.3)
Chloride: 110 mmol/L (ref 98–110)
Creat: 0.97 mg/dL (ref 0.60–1.26)
Globulin: 1.8 g/dL (calc) — ABNORMAL LOW (ref 1.9–3.7)
Glucose, Bld: 95 mg/dL (ref 65–99)
Potassium: 4.6 mmol/L (ref 3.5–5.3)
Sodium: 141 mmol/L (ref 135–146)
Total Bilirubin: 0.4 mg/dL (ref 0.2–1.2)
Total Protein: 5.9 g/dL — ABNORMAL LOW (ref 6.1–8.1)
eGFR: 106 mL/min/{1.73_m2} (ref >=60)

## 2021-04-20 LAB — LIPID PANEL
Cholesterol: 160 mg/dL (ref <200)
HDL: 38 mg/dL — ABNORMAL LOW (ref >=40)
LDL Cholesterol (Calc): 107 mg/dL (calc) — ABNORMAL HIGH
Non-HDL Cholesterol (Calc): 122 mg/dL (calc) (ref <130)
Total CHOL/HDL Ratio: 4.2 (calc) (ref <5.0)
Triglycerides: 67 mg/dL (ref <150)

## 2021-05-04 ENCOUNTER — Other Ambulatory Visit: Payer: Self-pay | Admitting: Family Medicine

## 2021-05-06 ENCOUNTER — Other Ambulatory Visit: Payer: Self-pay | Admitting: Family Medicine

## 2021-07-26 ENCOUNTER — Other Ambulatory Visit: Payer: Self-pay | Admitting: Family Medicine

## 2021-07-29 NOTE — Telephone Encounter (Signed)
LOV 04/16/21 ?Last refill 05/07/21, #21, 0 refills ? ?Please review, thanks! ? ?

## 2021-08-08 ENCOUNTER — Other Ambulatory Visit: Payer: Self-pay | Admitting: Family Medicine

## 2021-08-21 ENCOUNTER — Ambulatory Visit: Admission: EM | Admit: 2021-08-21 | Discharge: 2021-08-21 | Discharge status: Absent without leave | Payer: 59

## 2021-09-05 ENCOUNTER — Other Ambulatory Visit: Payer: Self-pay | Admitting: Family Medicine

## 2021-09-05 NOTE — Telephone Encounter (Signed)
Requested medication (s) are due for refill today:   Provider to review because it's a controlled substance that's non delegated.  ? ?Requested medication (s) are on the active medication list:   Yes ? ?Future visit scheduled:   No ? ? ?Last ordered: 07/29/2021 #21, 0 refills ? ?Returned because per protocol this cannot be delegated due to it being a controlled medication.  ? ?Requested Prescriptions  ?Pending Prescriptions Disp Refills  ? traMADol (ULTRAM) 50 MG tablet [Pharmacy Med Name: TRAMADOL HCL 50 MG TABLET] 21 tablet 0  ?  Sig: TAKE 1 TABLET BY MOUTH EVERY 8 HOURS AS NEEDED  ?  ? Not Delegated - Analgesics:  Opioid Agonists Failed - 09/05/2021  9:09 AM  ?  ?  Failed - This refill cannot be delegated  ?  ?  Failed - Urine Drug Screen completed in last 360 days  ?  ?  Failed - Valid encounter within last 3 months  ?  Recent Outpatient Visits   ? ?      ? 4 months ago Benign essential HTN  ? [facility], Priscille Heidelberg, MD  ? 1 year ago Fever, unspecified fever cause  ? [facility], Priscille Heidelberg, MD  ? 1 year ago Erroneous encounter - disregard  ? [facility], Priscille Heidelberg, MD  ? 2 years ago Chronic midline low back pain without sciatica  ? [facility], Priscille Heidelberg, MD  ? 2 years ago Otitis externa of both ears, unspecified chronicity, unspecified type  ? [facility], Priscille Heidelberg, MD  ? ?  ?  ? ? ?  ?  ?  ? ?

## 2021-09-06 NOTE — Telephone Encounter (Signed)
LOV 04/16/21 ?Last refill 07/29/21, #21, 0 refills ? ?Please review, thanks! ? ?

## 2021-09-21 ENCOUNTER — Other Ambulatory Visit: Payer: Self-pay | Admitting: Family Medicine

## 2021-09-23 ENCOUNTER — Other Ambulatory Visit: Payer: Self-pay | Admitting: Family Medicine

## 2021-09-23 ENCOUNTER — Encounter: Payer: Self-pay | Admitting: Family Medicine

## 2021-09-23 NOTE — Telephone Encounter (Signed)
Received eFax from pharmacy to request refill of  traMADol (ULTRAM) 50 MG tablet [[id number]]   Fax received from  [id number] Ginette Otto, [place] - [address]  [address] [place] [address]  Phone:  [phone]  Fax:  [phone]  DEA #:  [id number]  Please advise pharmacist.

## 2021-09-24 ENCOUNTER — Ambulatory Visit (INDEPENDENT_AMBULATORY_CARE_PROVIDER_SITE_OTHER): Payer: 59 | Admitting: Family Medicine

## 2021-09-24 VITALS — BP 132/68 | HR 73 | Temp 97.4°F | Ht >= 80 in | Wt 303.2 lb

## 2021-09-24 DIAGNOSIS — F331 Major depressive disorder, recurrent, moderate: Secondary | ICD-10-CM

## 2021-09-24 DIAGNOSIS — R0789 Other chest pain: Secondary | ICD-10-CM | POA: Diagnosis not present

## 2021-09-24 MED ORDER — MELOXICAM 15 MG PO TABS: 15.0000 mg | ORAL_TABLET | Freq: Every day | ORAL | 0 refills | Status: DC

## 2021-09-24 MED ORDER — VENLAFAXINE HCL ER 75 MG PO CP24: 150.0000 mg | ORAL_CAPSULE | Freq: Every day | ORAL | 3 refills | Status: DC

## 2021-09-24 NOTE — Telephone Encounter (Signed)
Requested medications are due for refill today.  unsure  Requested medications are on the active medications list.  yes  Last refill. 09/06/2021 #21 0 refills  Future visit scheduled.   no  Notes to clinic.  Medication refill is not delegated.    Requested Prescriptions  Pending Prescriptions Disp Refills   traMADol (ULTRAM) 50 MG tablet 21 tablet 0    Sig: Take 1 tablet (50 mg total) by mouth every 8 (eight) hours as needed.     Not Delegated - Analgesics:  Opioid Agonists Failed - 09/24/2021  8:47 AM      Failed - This refill cannot be delegated      Failed - Urine Drug Screen completed in last 360 days      Failed - Valid encounter within last 3 months    Recent Outpatient Visits           Today Atypical chest pain   [facility] Donita Brooks, MD   5 months ago Benign essential HTN   [facility] Tanya Nones, Priscille Heidelberg, MD   1 year ago Fever, unspecified fever cause   [facility] Tanya Nones Priscille Heidelberg, MD   1 year ago Erroneous encounter - disregard   [facility] Donita Brooks, MD   2 years ago Chronic midline low back pain without sciatica   [facility] Pickard, Priscille Heidelberg, MD

## 2021-09-24 NOTE — Telephone Encounter (Signed)
Requested medication (s) are due for refill today: yes  Requested medication (s) are on the active medication list: yes    Last refill: 09/06/21  #21  0 refills  Future visit scheduled Yes 09/24/21  Notes to clinic:Not delegated, please review. Pt has appt today  Requested Prescriptions  Pending Prescriptions Disp Refills   traMADol (ULTRAM) 50 MG tablet [Pharmacy Med Name: TRAMADOL HCL 50 MG TABLET] 21 tablet 0    Sig: TAKE 1 TABLET BY MOUTH EVERY 8 HOURS AS NEEDED     Not Delegated - Analgesics:  Opioid Agonists Failed - 09/21/2021 12:50 PM      Failed - This refill cannot be delegated      Failed - Urine Drug Screen completed in last 360 days      Failed - Valid encounter within last 3 months    Recent Outpatient Visits           5 months ago Benign essential HTN   [facility] Donita Brooks, MD   1 year ago Fever, unspecified fever cause   [facility] Pickard, Priscille Heidelberg, MD   1 year ago Erroneous encounter - disregard   [facility] Donita Brooks, MD   2 years ago Chronic midline low back pain without sciatica   [facility] Tanya Nones Priscille Heidelberg, MD   2 years ago Otitis externa of both ears, unspecified chronicity, unspecified type   [facility] Pickard, Priscille Heidelberg, MD

## 2021-09-24 NOTE — Progress Notes (Signed)
Subjective:    Patient ID: Roberto [name], male    DOB: July 07, 1990, 31 y.o.   MRN: [id number]  HPI Patient has a history of severe depression.  In the past he took Trintellix and had minimal success.  However his insurance would not cover this medication so he had to switch to Lexapro last year.  He felt like this medication did not help so he discontinued it altogether.  He been self-medicating with marijuana.  However he recently started working a job so he had to quit smoking marijuana.  Has been off marijuana for no more than a month.  He states that he is under tremendous amount of stress.  The depression is worsening.  Yesterday he felt like he had a breakdown.  He felt like he was going to start crying for no reason.  He reports depression and anhedonia.  He denies any suicidal thoughts.  However has been having increasing anxiety and even panic attacks.  This morning he states that he developed chest pain in the center of his chest.  Is been going on all day long.  There is no exertional component.  He denies any shortness of breath.  He thinks it is due to stress and anxiety.  He appears comfortable today and he is talking full and complete sentences without any respiratory distress.  He denies any radiation of the pain into his neck or into his jaw or into his left arm. Past Medical History:  Diagnosis Date   Benign essential HTN    Depression    Fatty liver disease, nonalcoholic    H/O: pneumonia    as infant   HLD (hyperlipidemia)    No past surgical history on file. Current Outpatient Medications on File Prior to Visit  Medication Sig Dispense Refill   losartan (COZAAR) 100 MG tablet TAKE 1 TABLET BY MOUTH EVERY DAY 90 tablet 1   traMADol (ULTRAM) 50 MG tablet TAKE 1 TABLET BY MOUTH EVERY 8 HOURS AS NEEDED 21 tablet 0   No current facility-administered medications on file prior to visit.   No Known Allergies Social History   Socioeconomic History   Marital status: Married     Spouse name: Not on file   Number of children: Not on file   Years of education: Not on file   Highest education level: Not on file  Occupational History   Not on file  Tobacco Use   Smoking status: Former    Types: E-cigarettes    Quit date: 03/31/2014    Years since quitting: 7.4   Smokeless tobacco: Never  Substance and Sexual Activity   Alcohol use: Yes   Drug use: No   Sexual activity: Never    Birth control/protection: Condom  Other Topics Concern   Not on file  Social History Narrative   Works Office manager.    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     Objective:   Physical Exam Vitals reviewed.  Cardiovascular:     Rate and Rhythm: Normal rate and regular rhythm.     Heart sounds: Normal heart sounds. No  murmur heard. Pulmonary:     Effort: Pulmonary effort is normal. No respiratory distress.     Breath sounds: Normal breath sounds. No wheezing or rales.  Abdominal:     General: Bowel sounds are normal. There is no distension.     Palpations: Abdomen is soft.     Tenderness: There is no abdominal tenderness. There is no rebound.          Assessment & Plan:  Atypical chest pain - Plan: EKG 12-Lead  Moderate episode of recurrent major depressive disorder (HCC) Patient's chest pain is very atypical and that is unrelated to exertion.  There is no shortness of breath.  There is no radiation.  There is no nausea.  I believe is most likely anxiety and stress.  [name] an EKG today.  Meanwhile start the patient on venlafaxine extended release 75 mg p.o. every morning and increase to 150 mg a week.  In  the past he has tried Wellbutrin, Trintellix, and Lexapro with minimal success.  Therefore I would like to try Effexor instead.  Reassess in 4 weeks or sooner if worsening.  EKG shows T wave inversion in lead III and aVF however this is nonspecific and also not seen in lead II.  The remainder of his EKG is normal.  Therefore I do not feel that his chest pain is cardiac.  EKG shows normal sinus rhythm with normal intervals and normal axis.

## 2021-09-24 NOTE — Telephone Encounter (Signed)
LOV 09/24/21 Last refill 09/06/21, #21, 0 refills  Please review, thanks!

## 2021-10-16 ENCOUNTER — Other Ambulatory Visit: Payer: Self-pay | Admitting: Family Medicine

## 2021-10-16 NOTE — Telephone Encounter (Signed)
Requested medications are due for refill today.  no  Requested medications are on the active medications list.  yes  Last refill. 09/24/2021 #60 3 refills  Future visit scheduled.   yes  Notes to clinic.  Pharmacy is asking for 90 day supply prescription.    Requested Prescriptions  Pending Prescriptions Disp Refills   venlafaxine XR (EFFEXOR-XR) 75 MG 24 hr capsule [Pharmacy Med Name: VENLAFAXINE HCL ER 75 MG CAP] 180 capsule 2    Sig: Take 2 capsules (150 mg total) by mouth daily with breakfast.     Psychiatry: Antidepressants - SNRI - desvenlafaxine & venlafaxine Failed - 10/16/2021  2:41 PM      Failed - Valid encounter within last 6 months    Recent Outpatient Visits           3 weeks ago Atypical chest pain   [facility] Donita Brooks, MD   6 months ago Benign essential HTN   [facility] Pickard, Priscille Heidelberg, MD   1 year ago Fever, unspecified fever cause   [facility] Pickard, Priscille Heidelberg, MD   1 year ago Erroneous encounter - disregard   [facility] Donita Brooks, MD   2 years ago Chronic midline low back pain without sciatica   [facility] Pickard, Priscille Heidelberg, MD       Future Appointments             In 1 week Tanya Nones, Priscille Heidelberg, MD [facility]            Failed - Lipid Panel in normal range within the last 12 months    Cholesterol  Date Value Ref Range Status  04/19/2021 160 <200 mg/dL Final   LDL Cholesterol (Calc)  Date Value Ref Range Status  04/19/2021 107 (H) mg/dL (calc) Final    Comment:    Reference range: <100 . Desirable range <100 mg/dL for primary prevention;   <70 mg/dL for patients with CHD or diabetic patients  with > or = 2 CHD risk factors. Marland Kitchen LDL-C is now calculated using the Martin-Hopkins  calculation, which is a validated novel method providing  better accuracy than the Friedewald equation in the  estimation of LDL-C.   Horald Pollen et al. Lenox Ahr. 2202;542(70): 2061-2068  (http://education.[employer].com/faq/FAQ164)    HDL  Date Value Ref Range Status  04/19/2021 38 (L) > OR = 40 mg/dL Final   Triglycerides  Date Value Ref Range Status  04/19/2021 67 <150 mg/dL Final         Passed - Cr in normal range and within 360 days    Creat  Date Value Ref Range Status  04/19/2021 0.97 0.60 - 1.26 mg/dL Final         Passed - Completed PHQ-2 or PHQ-9 in the last 360 days      Passed - Last BP in normal range    BP Readings from Last 1 Encounters:  09/24/21 132/68

## 2021-10-23 ENCOUNTER — Other Ambulatory Visit: Payer: Self-pay | Admitting: Family Medicine

## 2021-10-24 NOTE — Telephone Encounter (Signed)
Requested medications are due for refill today.  yes  Requested medications are on the active medications list.  yes  Last refill. 09/26/2021 #21 0 refills  Future visit scheduled.   yes  Notes to clinic.  Medication refill is not delegated.    Requested Prescriptions  Pending Prescriptions Disp Refills   traMADol (ULTRAM) 50 MG tablet [Pharmacy Med Name: TRAMADOL HCL 50 MG TABLET] 21 tablet 0    Sig: TAKE 1 TABLET BY MOUTH EVERY 8 HOURS AS NEEDED     Not Delegated - Analgesics:  Opioid Agonists Failed - 10/24/2021  8:22 AM      Failed - This refill cannot be delegated      Failed - Urine Drug Screen completed in last 360 days      Failed - Valid encounter within last 3 months    Recent Outpatient Visits           1 month ago Atypical chest pain   [facility] Donita Brooks, MD   6 months ago Benign essential HTN   [facility] Tanya Nones, Priscille Heidelberg, MD   1 year ago Fever, unspecified fever cause   [facility] Donita Brooks, MD   1 year ago Erroneous encounter - disregard   [facility] Donita Brooks, MD   2 years ago Chronic midline low back pain without sciatica   [facility] Pickard, Priscille Heidelberg, MD       Future Appointments             Tomorrow Pickard, Priscille Heidelberg, MD [facility]

## 2021-10-25 ENCOUNTER — Ambulatory Visit: Payer: 59 | Admitting: Family Medicine

## 2021-10-30 ENCOUNTER — Other Ambulatory Visit: Payer: Self-pay | Admitting: Family Medicine

## 2021-11-15 ENCOUNTER — Other Ambulatory Visit: Payer: Self-pay | Admitting: Family Medicine

## 2021-11-15 MED ORDER — TRAMADOL HCL 50 MG PO TABS: 50.0000 mg | ORAL_TABLET | Freq: Three times a day (TID) | ORAL | 0 refills | Status: DC | PRN

## 2021-11-15 NOTE — Telephone Encounter (Signed)
Patient has OV scheduled for 11/18/21

## 2021-11-18 ENCOUNTER — Encounter: Payer: Self-pay | Admitting: Family Medicine

## 2021-11-18 ENCOUNTER — Ambulatory Visit (INDEPENDENT_AMBULATORY_CARE_PROVIDER_SITE_OTHER): Payer: 59 | Admitting: Family Medicine

## 2021-11-18 VITALS — BP 128/76 | HR 58 | Temp 98.0°F | Ht >= 80 in | Wt 327.8 lb

## 2021-11-18 DIAGNOSIS — F331 Major depressive disorder, recurrent, moderate: Secondary | ICD-10-CM | POA: Diagnosis not present

## 2021-11-18 MED ORDER — VENLAFAXINE HCL ER 75 MG PO CP24: 225.0000 mg | ORAL_CAPSULE | Freq: Every day | ORAL | 5 refills | Status: DC

## 2021-11-18 NOTE — Progress Notes (Signed)
Subjective:    Patient ID: Roberto [name], male    DOB: 1991-02-05, 31 y.o.   MRN: [id number]  HPI 09/24/21 Patient has a history of severe depression.  In the past he took Trintellix and had minimal success.  However his insurance would not cover this medication so he had to switch to Lexapro last year.  He felt like this medication did not help so he discontinued it altogether.  He been self-medicating with marijuana.  However he recently started working a job so he had to quit smoking marijuana.  Has been off marijuana for no more than a month.  He states that he is under tremendous amount of stress.  The depression is worsening.  Yesterday he felt like he had a breakdown.  He felt like he was going to start crying for no reason.  He reports depression and anhedonia.  He denies any suicidal thoughts.  However has been having increasing anxiety and even panic attacks.  This morning he states that he developed chest pain in the center of his chest.  Is been going on all day long.  There is no exertional component.  He denies any shortness of breath.  He thinks it is due to stress and anxiety.  He appears comfortable today and he is talking full and complete sentences without any respiratory distress.  He denies any radiation of the pain into his neck or into his jaw or into his left arm.  At that time, my plan was:  Patient's chest pain is very atypical and that is unrelated to exertion.  There is no shortness of breath.  There is no radiation.  There is no nausea.  I believe is most likely anxiety and stress.  [name] an EKG today.  Meanwhile start the patient on venlafaxine extended release 75 mg p.o. every morning and increase to 150 mg a week.  In the past he has tried Wellbutrin, Trintellix, and Lexapro with minimal success.  Therefore I would like to try Effexor instead.  Reassess in 4 weeks or sooner if worsening.  EKG shows T wave inversion in lead III and aVF however this is nonspecific and also  not seen in lead II.  The remainder of his EKG is normal.  Therefore I do not feel that his chest pain is cardiac.  EKG shows normal sinus rhythm with normal intervals and normal axis.  11/18/21 Patient is here today to discuss a follow-up for his depression.  Since taking 150 mg of Effexor he feels like he is doing much better.  The depression has improved.  He can certainly tell if he does not take the medication.  He states that he will lose his temper more quickly with his wife or his children.  However he has not had any further episodes of chest pain.  His anxiety seems to be doing better and he has increased optimism.  He still reports lack of energy.  He denies suicidal ideation.  However he is interested in increasing the dose of the medication.  The patient is 6 foot 9 and 327 pounds. Past Medical History:  Diagnosis Date   Benign essential HTN    Depression    Fatty liver disease, nonalcoholic    H/O: pneumonia    as infant   HLD (hyperlipidemia)    No past surgical history on file. Current Outpatient Medications on File Prior to Visit  Medication Sig Dispense Refill   losartan (COZAAR) 100 MG tablet TAKE 1 TABLET  BY MOUTH EVERY DAY 90 tablet 1   meloxicam (MOBIC) 15 MG tablet TAKE 1 TABLET (15 MG TOTAL) BY MOUTH DAILY. 30 tablet 0   traMADol (ULTRAM) 50 MG tablet Take 1 tablet (50 mg total) by mouth every 8 (eight) hours as needed. 21 tablet 0   No current facility-administered medications on file prior to visit.   No Known Allergies Social History   Socioeconomic History   Marital status: Married    Spouse name: Not on file   Number of children: Not on file   Years of education: Not on file   Highest education level: Not on file  Occupational History   Not on file  Tobacco Use   Smoking status: Former    Types: E-cigarettes    Quit date: 03/31/2014    Years since quitting: 7.6   Smokeless tobacco: Never  Substance and Sexual Activity   Alcohol use: Yes   Drug use:  No   Sexual activity: Never    Birth control/protection: Condom  Other Topics Concern   Not on file  Social History Narrative   Works Office manager.    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      Objective:   Physical Exam Vitals reviewed.  Cardiovascular:     Rate and Rhythm: Normal rate and regular rhythm.     Heart sounds: Normal heart sounds. No murmur heard. Pulmonary:     Effort: Pulmonary effort is normal. No respiratory distress.     Breath sounds: Normal breath sounds. No wheezing or rales.  Abdominal:     General: Bowel sounds are normal. There is no distension.     Palpations: Abdomen is soft.     Tenderness: There is no abdominal tenderness. There is no rebound.           Assessment & Plan:  Moderate episode of recurrent major depressive disorder (HCC) Increase Effexor XR to 225 mg daily.  Reassess in 4 weeks.  I believe this dose is more appropriate for his body size.  We also discussed potentially adding Wellbutrin in place of a higher dose of Effexor however the patient is interested in trying a higher dose of Effexor first.  He also continues to report wrist pain bilaterally.  I recommended doing an x-ray of his wrist however his insurance right now does not provide good coverage and he would prefer to wait until he has better insurance in place for cost.  He will continue to use meloxicam as needed for the wrist pain until we are able to order the x-ray

## 2021-11-25 ENCOUNTER — Other Ambulatory Visit: Payer: Self-pay | Admitting: Family Medicine

## 2022-01-08 NOTE — Progress Notes (Deleted)
r fail to improve as anticipated.

## 2022-01-08 NOTE — Progress Notes (Deleted)
   Subjective:    Patient ID: Roberto [name], male    DOB: 1991/04/09, 31 y.o.   MRN: [id number]  HPI Roberto [name] is a 31 y.o. male who complains of {uri sx:315001} for *** days. He denies a history of {hx resp sx additional:315009} and {has/denies:315300} a history of asthma. Patient {has/denies:315300} smoke cigarettes.  Past Medical History:  Diagnosis Date   Benign essential HTN    Depression    Fatty liver disease, nonalcoholic    H/O: pneumonia    as infant   HLD (hyperlipidemia)    No past surgical history on file. Current Outpatient Medications on File Prior to Visit  Medication Sig Dispense Refill   losartan (COZAAR) 100 MG tablet TAKE 1 TABLET BY MOUTH EVERY DAY 90 tablet 1   meloxicam (MOBIC) 15 MG tablet TAKE 1 TABLET (15 MG TOTAL) BY MOUTH DAILY. 30 tablet 1   traMADol (ULTRAM) 50 MG tablet Take 1 tablet (50 mg total) by mouth every 8 (eight) hours as needed. 21 tablet 0   venlafaxine XR (EFFEXOR XR) 75 MG 24 hr capsule Take 3 capsules (225 mg total) by mouth daily with breakfast. 90 capsule 5   No current facility-administered medications on file prior to visit.   No Known Allergies      OBJECTIVE: He appears well, vital signs are as noted. Ears normal.  Throat and pharynx normal.  Neck supple. No adenopathy in the neck. Nose is congested. Sinuses non tender. The chest is clear, without wheezes or rales.  ASSESSMENT:  {uri dx:315273::viral upper respiratory illness}  PLAN: Symptomatic therapy suggested: {resp plan:315236}. Lack of antibiotic effectiveness discussed with him. Call or return to clinic prn if these symptoms worsen o    Review of Systems     Objective:   Physical Exam        Assessment & Plan:

## 2022-01-09 ENCOUNTER — Ambulatory Visit: Payer: 59 | Admitting: Family Medicine

## 2022-02-01 ENCOUNTER — Encounter: Payer: Self-pay | Admitting: Family Medicine

## 2022-02-10 ENCOUNTER — Other Ambulatory Visit: Payer: Self-pay | Admitting: Family Medicine

## 2022-02-10 MED ORDER — BUPROPION HCL ER (XL) 150 MG PO TB24: 150.0000 mg | ORAL_TABLET | Freq: Every day | ORAL | 5 refills | Status: DC

## 2022-02-27 ENCOUNTER — Other Ambulatory Visit: Payer: Self-pay

## 2022-02-27 DIAGNOSIS — F331 Major depressive disorder, recurrent, moderate: Secondary | ICD-10-CM

## 2022-02-27 MED ORDER — VENLAFAXINE HCL ER 75 MG PO CP24: 225.0000 mg | ORAL_CAPSULE | Freq: Every day | ORAL | 5 refills | Status: DC

## 2022-03-04 ENCOUNTER — Telehealth: Payer: Self-pay

## 2022-03-04 MED ORDER — LOSARTAN POTASSIUM 100 MG PO TABS: 100.0000 mg | ORAL_TABLET | Freq: Every day | ORAL | 1 refills | Status: DC

## 2022-03-04 NOTE — Telephone Encounter (Signed)
  Prescription Request  03/04/2022  Is this a "Controlled Substance" medicine? No  LOV: 01/09/2022   What is the name of the medication or equipment? losartan (COZAAR) 100 MG tablet [[id number]]   Have you contacted your pharmacy to request a refill? Yes   Which pharmacy would you like this sent to? [id number] [place], [place] - [address] Patient notified that their request is being sent to the clinical staff for review and that they should receive a response within 2 business days.   Please advise at [phone] (mobile)

## 2022-03-04 NOTE — Addendum Note (Signed)
Addended by: Colman Cater on: 03/04/2022 11:36 AM   Modules accepted: Orders

## 2022-03-17 ENCOUNTER — Encounter: Payer: Self-pay | Admitting: Family Medicine

## 2022-03-17 ENCOUNTER — Other Ambulatory Visit: Payer: Self-pay

## 2022-03-17 DIAGNOSIS — I10 Essential (primary) hypertension: Secondary | ICD-10-CM

## 2022-03-17 MED ORDER — LOSARTAN POTASSIUM 100 MG PO TABS: 100.0000 mg | ORAL_TABLET | Freq: Every day | ORAL | 1 refills | Status: DC

## 2022-06-03 ENCOUNTER — Encounter: Payer: Self-pay | Admitting: Family Medicine

## 2022-06-05 ENCOUNTER — Other Ambulatory Visit: Payer: Self-pay | Admitting: Family Medicine

## 2022-06-05 MED ORDER — TRAMADOL HCL 50 MG PO TABS: 50.0000 mg | ORAL_TABLET | Freq: Three times a day (TID) | ORAL | 0 refills | Status: DC | PRN

## 2022-06-18 ENCOUNTER — Other Ambulatory Visit: Payer: Self-pay | Admitting: Family Medicine

## 2022-06-21 ENCOUNTER — Other Ambulatory Visit: Payer: Self-pay | Admitting: Family Medicine

## 2022-06-23 NOTE — Telephone Encounter (Signed)
Requested medications are due for refill today.  unsure  Requested medications are on the active medications list.  yes  Last refill. 06/05/2022 #21 0 rf  Future visit scheduled.   no  Notes to clinic.  Refill not delegated.    Requested Prescriptions  Pending Prescriptions Disp Refills   traMADol (ULTRAM) 50 MG tablet [Pharmacy Med Name: traMADol HCl Oral Tablet 50 MG] 21 tablet 0    Sig: take 1 tablet by mouth every 8 hours as needed     Not Delegated - Analgesics:  Opioid Agonists Failed - 06/21/2022 11:21 AM      Failed - This refill cannot be delegated      Failed - Urine Drug Screen completed in last 360 days      Failed - Valid encounter within last 3 months    Recent Outpatient Visits           9 months ago Atypical chest pain   [place] Susy Frizzle, MD   1 year ago Benign essential HTN   [place] Susy Frizzle, MD   2 years ago Fever, unspecified fever cause   [place] Dennard Schaumann Cammie Mcgee, MD   2 years ago Erroneous encounter - disregard   [place] Susy Frizzle, MD   2 years ago Chronic midline low back pain without sciatica   [place] Pickard, Cammie Mcgee, MD

## 2022-06-30 ENCOUNTER — Other Ambulatory Visit: Payer: Self-pay | Admitting: Family Medicine

## 2022-06-30 MED ORDER — TRAMADOL HCL 50 MG PO TABS: 50.0000 mg | ORAL_TABLET | Freq: Three times a day (TID) | ORAL | 0 refills | Status: DC | PRN

## 2022-07-09 ENCOUNTER — Other Ambulatory Visit: Payer: Self-pay | Admitting: Family Medicine

## 2022-07-17 ENCOUNTER — Encounter: Payer: Self-pay | Admitting: Family Medicine

## 2022-07-17 ENCOUNTER — Ambulatory Visit: Payer: BC Managed Care – PPO | Admitting: Family Medicine

## 2022-07-17 VITALS — BP 118/72 | HR 74 | Temp 98.3°F | Ht >= 80 in | Wt 337.0 lb

## 2022-07-17 DIAGNOSIS — F331 Major depressive disorder, recurrent, moderate: Secondary | ICD-10-CM

## 2022-07-17 DIAGNOSIS — M79644 Pain in right finger(s): Secondary | ICD-10-CM | POA: Diagnosis not present

## 2022-07-17 NOTE — Progress Notes (Signed)
Subjective:    Patient ID: Roberto [name], male    DOB: 07-31-1990, 32 y.o.   MRN: [id number]  HPI 09/24/21 Patient has a history of severe depression.  In the past he took Trintellix and had minimal success.  However his insurance would not cover this medication so he had to switch to Lexapro last year.  He felt like this medication did not help so he discontinued it altogether.  He been self-medicating with marijuana.  However he recently started working a job so he had to quit smoking marijuana.  Has been off marijuana for no more than a month.  He states that he is under tremendous amount of stress.  The depression is worsening.  Yesterday he felt like he had a breakdown.  He felt like he was going to start crying for no reason.  He reports depression and anhedonia.  He denies any suicidal thoughts.  However has been having increasing anxiety and even panic attacks.  This morning he states that he developed chest pain in the center of his chest.  Is been going on all day long.  There is no exertional component.  He denies any shortness of breath.  He thinks it is due to stress and anxiety.  He appears comfortable today and he is talking full and complete sentences without any respiratory distress.  He denies any radiation of the pain into his neck or into his jaw or into his left arm.  At that time, my plan was:  Patient's chest pain is very atypical and that is unrelated to exertion.  There is no shortness of breath.  There is no radiation.  There is no nausea.  I believe is most likely anxiety and stress.  Georgina Peer check an EKG today.  Meanwhile start the patient on venlafaxine extended release 75 mg p.o. every morning and increase to 150 mg a week.  In the past he has tried Wellbutrin, Trintellix, and Lexapro with minimal success.  Therefore I would like to try Effexor instead.  Reassess in 4 weeks or sooner if worsening.  EKG shows T wave inversion in lead III and aVF however this is nonspecific and also  not seen in lead II.  The remainder of his EKG is normal.  Therefore I do not feel that his chest pain is cardiac.  EKG shows normal sinus rhythm with normal intervals and normal axis.  11/18/21 Patient is here today to discuss a follow-up for his depression.  Since taking 150 mg of Effexor he feels like he is doing much better.  The depression has improved.  He can certainly tell if he does not take the medication.  He states that he will lose his temper more quickly with his wife or his children.  However he has not had any further episodes of chest pain.  His anxiety seems to be doing better and he has increased optimism.  He still reports lack of energy.  He denies suicidal ideation.  However he is interested in increasing the dose of the medication.  The patient is 6 foot 9 and 327 pounds.  At that time, my plan was: Increase Effexor XR to 225 mg daily.  Reassess in 4 weeks.  I believe this dose is more appropriate for his body size.  We also discussed potentially adding Wellbutrin in place of a higher dose of Effexor however the patient is interested in trying a higher dose of Effexor first.  He also continues to report wrist pain bilaterally.  I recommended doing an x-ray of his wrist however his insurance right now does not provide good coverage and he would prefer to wait until he has better insurance in place for cost.  He will continue to use meloxicam as needed for the wrist pain until we are able to order the x-ray  07/17/22 Patient has maternal uncle who committed suicide.  His mother has dealt with severe depression.  Her his brother has been diagnosed with bipolar.  The patient reports mood swings.  He states every 3 to 4 months he will have periods where he feels extremely happy and joyful.  He will then have periods where he crashes and feels very sad and even has suicidal thoughts.  Recently he was driving and felt that he could wreck his car and that he would be better off if he did.   However he denies that he has any desire to commit suicide.  He denies any plan.  However he does report that he feels depressed with anhedonia and sadness.  He denies any periods where he had manic symptoms such as racing thoughts, flight of ideas, increased impulsive behavior.  He has self medicated in the past with marijuana for his depression.  He denies any hallucinations or delusions.  However he does report mood swings and trouble controlling his temper.  He did not feel that Wellbutrin helped at all.  He is not sure if the Effexor is helping.  Of note he reports pain and swelling around his right third PIP joint for 1 month Past Medical History:  Diagnosis Date   Benign essential HTN    Depression    Fatty liver disease, nonalcoholic    H/O: pneumonia    as infant   HLD (hyperlipidemia)    No past surgical history on file. Current Outpatient Medications on File Prior to Visit  Medication Sig Dispense Refill   buPROPion (WELLBUTRIN XL) 150 MG 24 hr tablet Take 1 tablet (150 mg total) by mouth daily. 30 tablet 5   losartan (COZAAR) 100 MG tablet Take 1 tablet (100 mg total) by mouth daily. 90 tablet 1   meloxicam (MOBIC) 15 MG tablet TAKE 1 TABLET (15 MG TOTAL) BY MOUTH DAILY. 30 tablet 1   traMADol (ULTRAM) 50 MG tablet Take 1 tablet (50 mg total) by mouth every 8 (eight) hours as needed. 21 tablet 0   venlafaxine XR (EFFEXOR XR) 75 MG 24 hr capsule Take 3 capsules (225 mg total) by mouth daily with breakfast. 90 capsule 5   No current facility-administered medications on file prior to visit.   No Known Allergies Social History   Socioeconomic History   Marital status: Married    Spouse name: Not on file   Number of children: Not on file   Years of education: Not on file   Highest education level: Not on file  Occupational History   Not on file  Tobacco Use   Smoking status: Former    Types: E-cigarettes    Quit date: 03/31/2014    Years since quitting: 8.3   Smokeless  tobacco: Never  Substance and Sexual Activity   Alcohol use: Yes   Drug use: No   Sexual activity: Never    Birth control/protection: Condom  Other Topics Concern   Not on file  Social History Narrative   Works Designer, multimedia.    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      Objective:   Physical Exam Vitals reviewed.  Cardiovascular:     Rate and Rhythm: Normal rate and regular rhythm.     Heart sounds: Normal heart sounds. No murmur heard. Pulmonary:     Effort: Pulmonary effort is normal. No respiratory distress.     Breath sounds: Normal breath sounds. No wheezing or rales.  Abdominal:     General: Bowel sounds are normal. There is no distension.     Palpations: Abdomen is soft.     Tenderness: There is no abdominal tenderness. There is no rebound.           Assessment & Plan:  Pain of finger of right hand - Plan: DG Hand Complete Right  Moderate episode of recurrent major depressive disorder ([place]) Patient has moderate depression.  He has tried and failed Effexor as well as Wellbutrin and is now having great through suicidal ideation.  Recommended adding Rexulti 1 mg daily to the Effexor and reassessing in 1 month.  Obtain x-ray of the right hand to evaluate the pain in his right third PIP joint that is swollen.  He also reports vivid dreams trouble sleeping snoring and headache every morning when he wakes up.  I have recommended a sleep study to evaluate sleep apnea but he declines at the present time

## 2022-07-18 ENCOUNTER — Ambulatory Visit (HOSPITAL_COMMUNITY)
Admission: RE | Admit: 2022-07-18 | Discharge: 2022-07-18 | Disposition: A | Discharge status: Home / Self Care | Payer: BC Managed Care – PPO | Source: Ambulatory Visit | Attending: Family Medicine | Admitting: Family Medicine

## 2022-07-18 DIAGNOSIS — M79644 Pain in right finger(s): Secondary | ICD-10-CM | POA: Insufficient documentation

## 2022-07-22 ENCOUNTER — Encounter: Payer: Self-pay | Admitting: Family Medicine

## 2022-07-22 ENCOUNTER — Other Ambulatory Visit: Payer: Self-pay | Admitting: Family Medicine

## 2022-07-22 MED ORDER — TRAMADOL HCL 50 MG PO TABS: 50.0000 mg | ORAL_TABLET | Freq: Three times a day (TID) | ORAL | 0 refills | Status: DC | PRN

## 2022-08-14 ENCOUNTER — Encounter: Payer: Self-pay | Admitting: Family Medicine

## 2022-08-15 ENCOUNTER — Other Ambulatory Visit: Payer: Self-pay

## 2022-08-15 DIAGNOSIS — F331 Major depressive disorder, recurrent, moderate: Secondary | ICD-10-CM

## 2022-08-15 MED ORDER — BREXPIPRAZOLE 1 MG PO TABS: 1.0000 mg | ORAL_TABLET | Freq: Every day | ORAL | 3 refills | Status: DC

## 2022-08-18 ENCOUNTER — Other Ambulatory Visit: Payer: Self-pay

## 2022-08-18 DIAGNOSIS — F331 Major depressive disorder, recurrent, moderate: Secondary | ICD-10-CM

## 2022-08-18 MED ORDER — BREXPIPRAZOLE 1 MG PO TABS: 1.0000 mg | ORAL_TABLET | Freq: Every day | ORAL | 3 refills | Status: DC

## 2022-08-20 ENCOUNTER — Telehealth: Payer: Self-pay

## 2022-08-20 NOTE — Telephone Encounter (Signed)
PA for Rexulti was denied because pt has not tried: Aripiprazole Olanzapine Paliperidone Quetiapine extended release Risperidone Ziprasidone  Thanks.

## 2022-08-21 ENCOUNTER — Other Ambulatory Visit: Payer: Self-pay

## 2022-08-21 DIAGNOSIS — F331 Major depressive disorder, recurrent, moderate: Secondary | ICD-10-CM

## 2022-08-21 MED ORDER — ARIPIPRAZOLE 5 MG PO TABS: 5.0000 mg | ORAL_TABLET | Freq: Every day | ORAL | 1 refills | Status: DC

## 2022-08-26 ENCOUNTER — Other Ambulatory Visit: Payer: Self-pay | Admitting: Family Medicine

## 2022-08-26 DIAGNOSIS — F331 Major depressive disorder, recurrent, moderate: Secondary | ICD-10-CM

## 2022-08-26 NOTE — Telephone Encounter (Signed)
Requested Prescriptions  Pending Prescriptions Disp Refills   venlafaxine XR (EFFEXOR-XR) 75 MG 24 hr capsule [Pharmacy Med Name: Venlafaxine HCl ER Oral Capsule Extended Release 24 Hour 75 MG] 270 capsule 0    Sig: TAKE 3 CAPSULES BY MOUTH DAILY WITH BREAKFAST     Psychiatry: Antidepressants - SNRI - desvenlafaxine & venlafaxine Failed - 08/26/2022  9:34 AM      Failed - Cr in normal range and within 360 days    Creat  Date Value Ref Range Status  04/19/2021 0.97 0.60 - 1.26 mg/dL Final         Failed - Valid encounter within last 6 months    Recent Outpatient Visits           11 months ago Atypical chest pain   [facility] Donita Brooks, MD   1 year ago Benign essential HTN   [facility] Donita Brooks, MD   2 years ago Fever, unspecified fever cause   [facility] Pickard, Priscille Heidelberg, MD   2 years ago Erroneous encounter - disregard   [facility] Donita Brooks, MD   3 years ago Chronic midline low back pain without sciatica   [facility] Tanya Nones, Priscille Heidelberg, MD              Failed - Lipid Panel in normal range within the last 12 months    Cholesterol  Date Value Ref Range Status  04/19/2021 160 <200 mg/dL Final   LDL Cholesterol (Calc)  Date Value Ref Range Status  04/19/2021 107 (H) mg/dL (calc) Final    Comment:    Reference range: <100 . Desirable range <100 mg/dL for primary prevention;   <70 mg/dL for patients with CHD or diabetic patients  with > or = 2 CHD risk factors. Marland Kitchen LDL-C is now calculated using the Martin-Hopkins  calculation, which is a validated novel method providing  better accuracy than the Friedewald equation in the  estimation of LDL-C.  Horald Pollen et al. Lenox Ahr. 1610;960(45): 2061-2068  (http://education.[facility].com/faq/FAQ164)    HDL  Date Value Ref Range Status  04/19/2021 38 (L) > OR = 40 mg/dL Final   Triglycerides  Date Value Ref  Range Status  04/19/2021 67 <150 mg/dL Final         Passed - Completed PHQ-2 or PHQ-9 in the last 360 days      Passed - Last BP in normal range    BP Readings from Last 1 Encounters:  07/17/22 118/72

## 2022-08-29 ENCOUNTER — Telehealth: Payer: Self-pay

## 2022-08-29 NOTE — Telephone Encounter (Signed)
My Chart message from patient:  Med abilify is not working for me. I have been getting headaches and trouble sleeping. And my wife has noticed im grummpier and "off". I need to stop this med and try something else.

## 2022-09-02 ENCOUNTER — Ambulatory Visit: Payer: BC Managed Care – PPO | Admitting: Family Medicine

## 2022-09-02 ENCOUNTER — Encounter: Payer: Self-pay | Admitting: Family Medicine

## 2022-09-02 VITALS — BP 132/80 | HR 81 | Ht >= 80 in | Wt 338.0 lb

## 2022-09-02 DIAGNOSIS — F331 Major depressive disorder, recurrent, moderate: Secondary | ICD-10-CM

## 2022-09-02 NOTE — Progress Notes (Signed)
Subjective:    Patient ID: Roberto [name], male    DOB: 07-31-1990, 32 y.o.   MRN: [id number]  HPI 09/24/21 Patient has a history of severe depression.  In the past he took Trintellix and had minimal success.  However his insurance would not cover this medication so he had to switch to Lexapro last year.  He felt like this medication did not help so he discontinued it altogether.  He been self-medicating with marijuana.  However he recently started working a job so he had to quit smoking marijuana.  Has been off marijuana for no more than a month.  He states that he is under tremendous amount of stress.  The depression is worsening.  Yesterday he felt like he had a breakdown.  He felt like he was going to start crying for no reason.  He reports depression and anhedonia.  He denies any suicidal thoughts.  However has been having increasing anxiety and even panic attacks.  This morning he states that he developed chest pain in the center of his chest.  Is been going on all day long.  There is no exertional component.  He denies any shortness of breath.  He thinks it is due to stress and anxiety.  He appears comfortable today and he is talking full and complete sentences without any respiratory distress.  He denies any radiation of the pain into his neck or into his jaw or into his left arm.  At that time, my plan was:  Patient's chest pain is very atypical and that is unrelated to exertion.  There is no shortness of breath.  There is no radiation.  There is no nausea.  I believe is most likely anxiety and stress.  Georgina Peer check an EKG today.  Meanwhile start the patient on venlafaxine extended release 75 mg p.o. every morning and increase to 150 mg a week.  In the past he has tried Wellbutrin, Trintellix, and Lexapro with minimal success.  Therefore I would like to try Effexor instead.  Reassess in 4 weeks or sooner if worsening.  EKG shows T wave inversion in lead III and aVF however this is nonspecific and also  not seen in lead II.  The remainder of his EKG is normal.  Therefore I do not feel that his chest pain is cardiac.  EKG shows normal sinus rhythm with normal intervals and normal axis.  11/18/21 Patient is here today to discuss a follow-up for his depression.  Since taking 150 mg of Effexor he feels like he is doing much better.  The depression has improved.  He can certainly tell if he does not take the medication.  He states that he will lose his temper more quickly with his wife or his children.  However he has not had any further episodes of chest pain.  His anxiety seems to be doing better and he has increased optimism.  He still reports lack of energy.  He denies suicidal ideation.  However he is interested in increasing the dose of the medication.  The patient is 6 foot 9 and 327 pounds.  At that time, my plan was: Increase Effexor XR to 225 mg daily.  Reassess in 4 weeks.  I believe this dose is more appropriate for his body size.  We also discussed potentially adding Wellbutrin in place of a higher dose of Effexor however the patient is interested in trying a higher dose of Effexor first.  He also continues to report wrist pain bilaterally.  I recommended doing an x-ray of his wrist however his insurance right now does not provide good coverage and he would prefer to wait until he has better insurance in place for cost.  He will continue to use meloxicam as needed for the wrist pain until we are able to order the x-ray  07/17/22 Patient has maternal uncle who committed suicide.  His mother has dealt with severe depression.  Her his brother has been diagnosed with bipolar.  The patient reports mood swings.  He states every 3 to 4 months he will have periods where he feels extremely happy and joyful.  He will then have periods where he crashes and feels very sad and even has suicidal thoughts.  Recently he was driving and felt that he could wreck his car and that he would be better off if he did.   However he denies that he has any desire to commit suicide.  He denies any plan.  However he does report that he feels depressed with anhedonia and sadness.  He denies any periods where he had manic symptoms such as racing thoughts, flight of ideas, increased impulsive behavior.  He has self medicated in the past with marijuana for his depression.  He denies any hallucinations or delusions.  However he does report mood swings and trouble controlling his temper.  He did not feel that Wellbutrin helped at all.  He is not sure if the Effexor is helping.  Of note he reports pain and swelling around his right third PIP joint for 1 month.  At that time, my plan was: Patient has moderate depression.  He has tried and failed Effexor as well as Wellbutrin and is now having break through suicidal ideation.  Recommended adding Rexulti 1 mg daily to the Effexor and reassessing in 1 month.  Obtain x-ray of the right hand to evaluate the pain in his right third PIP joint that is swollen.  He also reports vivid dreams trouble sleeping snoring and headache every morning when he wakes up.  I have recommended a sleep study to evaluate sleep apnea but he declines at the present time  09/02/22 Patient took Rexulti for 1 month with samples.  He states that he was doing much better on the Rexulti.  The depression improved.  The suicidal thoughts improved.  However his insurance refused to cover the Rexulti despite having tried and failed Effexor and Wellbutrin.  Therefore they mandated that we switch to Abilify.  The patient has taken Abilify for 1-1/2 weeks.  Since taking the medication, he reports feeling more irritable.  He reports that the suicidal thoughts have returned.  He is losing his temper with his wife.  He is becoming easily annoyed.  He is fearful and anxious.  He feels nervous.  He has had thoughts that he would be better off dead.  He reports feeling no energy, sleeping too much, feeling hopeless.  His PHQ-9 score is  14 Past Medical History:  Diagnosis Date   Benign essential HTN    Depression    Fatty liver disease, nonalcoholic    H/O: pneumonia    as infant   HLD (hyperlipidemia)    No past surgical history on file. Current Outpatient Medications on File Prior to Visit  Medication Sig Dispense Refill   losartan (COZAAR) 100 MG tablet Take 1 tablet (100 mg total) by mouth daily. 90 tablet 1   traMADol (ULTRAM) 50 MG tablet Take 1 tablet (50 mg total) by mouth every 8 (  eight) hours as needed. 21 tablet 0   venlafaxine XR (EFFEXOR-XR) 75 MG 24 hr capsule TAKE 3 CAPSULES BY MOUTH DAILY WITH BREAKFAST 270 capsule 0   No current facility-administered medications on file prior to visit.   No Known Allergies Social History   Socioeconomic History   Marital status: Married    Spouse name: Not on file   Number of children: Not on file   Years of education: Not on file   Highest education level: Not on file  Occupational History   Not on file  Tobacco Use   Smoking status: Former    Types: E-cigarettes    Quit date: 03/31/2014    Years since quitting: 8.4   Smokeless tobacco: Never  Substance and Sexual Activity   Alcohol use: Yes   Drug use: No   Sexual activity: Never    Birth control/protection: Condom  Other Topics Concern   Not on file  Social History Narrative   Works Office manager.    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      Objective:   Physical Exam Vitals reviewed.  Cardiovascular:     Rate and Rhythm: Normal rate and regular rhythm.     Heart sounds: Normal heart sounds. No murmur heard. Pulmonary:      Effort: Pulmonary effort is normal. No respiratory distress.     Breath sounds: Normal breath sounds. No wheezing or rales.  Abdominal:     General: Bowel sounds are normal. There is no distension.     Palpations: Abdomen is soft.     Tenderness: There is no abdominal tenderness. There is no rebound.          Assessment & Plan:  Moderate episode of recurrent major depressive disorder ([facility]) Patient has tried and failed Wellbutrin, Effexor, and now Abilify.  He is having suicidal thoughts.  He has done well on Rexulti earlier this year when he took it for a month.  I have asked him to stop the Abilify immediately and I gave him every sample of Rexulti 1 mg that I had.  I will try to reach out to the drug manufacturer to see why the co-pay card is not reducing the price for the patient.  We can also try to appeal the insurance company to see if we can get the Rexulti approved

## 2022-09-03 ENCOUNTER — Telehealth: Payer: Self-pay

## 2022-09-03 NOTE — Telephone Encounter (Signed)
Spoke with Kenard Gower, rep for Rexulti. He is going to come by on 09/04/22 and bring samples for the patient. He is also going to reach out to a colleague to see if he can help get the Rexulti approved through the patient's insurance. Thanks.

## 2022-09-10 ENCOUNTER — Telehealth: Payer: Self-pay

## 2022-09-10 NOTE — Telephone Encounter (Signed)
Roberto [name] (Key: [id number])   Your information has been submitted to [employer]. To check for an updated outcome later, reopen this PA request from your dashboard.  If [employer] has not responded to your request within 24 hours, contact [employer] at [phone]. If you think there may be a problem with your PA request, use our live chat feature at the bottom right.

## 2022-09-14 ENCOUNTER — Other Ambulatory Visit: Payer: Self-pay | Admitting: Family Medicine

## 2022-09-14 DIAGNOSIS — I10 Essential (primary) hypertension: Secondary | ICD-10-CM

## 2022-09-15 NOTE — Telephone Encounter (Signed)
OV 05/27/22 Requested Prescriptions  Pending Prescriptions Disp Refills   losartan (COZAAR) 100 MG tablet [Pharmacy Med Name: Losartan Potassium Oral Tablet 100 MG] 90 tablet 0    Sig: TAKE ONE TABLET BY MOUTH ONE TIME DAILY     Cardiovascular:  Angiotensin Receptor Blockers Failed - 09/14/2022 10:05 AM      Failed - Cr in normal range and within 180 days    Creat  Date Value Ref Range Status  04/19/2021 0.97 0.60 - 1.26 mg/dL Final         Failed - K in normal range and within 180 days    Potassium  Date Value Ref Range Status  04/19/2021 4.6 3.5 - 5.3 mmol/L Final         Failed - Valid encounter within last 6 months    Recent Outpatient Visits           11 months ago Atypical chest pain   [facility] Donita Brooks, MD   1 year ago Benign essential HTN   [facility] Donita Brooks, MD   2 years ago Fever, unspecified fever cause   [facility] Tanya Nones, Priscille Heidelberg, MD   2 years ago Erroneous encounter - disregard   [facility] Donita Brooks, MD   3 years ago Chronic midline low back pain without sciatica   [facility] Pickard, Priscille Heidelberg, MD              Passed - Patient is not pregnant      Passed - Last BP in normal range    BP Readings from Last 1 Encounters:  09/02/22 132/80

## 2022-10-07 ENCOUNTER — Ambulatory Visit
Admission: EM | Admit: 2022-10-07 | Discharge: 2022-10-07 | Disposition: A | Discharge status: Home / Self Care | Payer: No Typology Code available for payment source

## 2022-10-07 ENCOUNTER — Encounter: Payer: Self-pay | Admitting: Emergency Medicine

## 2022-10-07 DIAGNOSIS — S0502XA Injury of conjunctiva and corneal abrasion without foreign body, left eye, initial encounter: Secondary | ICD-10-CM | POA: Diagnosis not present

## 2022-10-07 MED ORDER — TETANUS-DIPHTH-ACELL PERTUSSIS 5-2.5-18.5 LF-MCG/0.5 IM SUSY: 0.5000 mL | PREFILLED_SYRINGE | Freq: Once | INTRAMUSCULAR | Status: DC

## 2022-10-07 MED ORDER — OFLOXACIN 0.3 % OP SOLN: 2.0000 [drp] | Freq: Four times a day (QID) | OPHTHALMIC | 0 refills | Status: DC

## 2022-10-07 NOTE — ED Provider Notes (Addendum)
[facility]    CSN: [id number] Arrival date & time: 10/07/22  1304      History   Chief Complaint No chief complaint on file.   HPI Roberto [name] is a 32 y.o. male.   The history is provided by the patient.   The patient presents for complaints of left eye pain that occurred while he was at work.  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  Since that time, patient states he feels like there is "something in his eye.  He states that he attempted to flush the eye several times while he was at work.  He states that now, he continues to feel like there is something in the lower portion of his eye.  Patient states that he does notice this more when he is looking down.  Patient denies fever, chills, eye drainage, eye redness, change in vision, sudden loss of vision, blurred vision, headache, dizziness or lightheadedness.  Patient denies the use of contacts or eyeglasses.  The patient's last Tdap was in November 2020.  Past Medical History:  Diagnosis Date   Benign essential HTN    Depression    Fatty liver disease, nonalcoholic    H/O: pneumonia    as infant   HLD (hyperlipidemia)     Patient Active Problem List   Diagnosis Date Noted   Elevated LFTs 06/12/2014   Obesity 06/05/2014   Depression 06/05/2014   Obstructive sleep apnea 06/05/2014   Esophageal reflux 06/05/2014    History reviewed. No pertinent surgical history.     Home Medications    Prior to Admission medications   Medication Sig Start Date End Date Taking? Authorizing Provider  ofloxacin (OCUFLOX) 0.3 % ophthalmic solution Place 2 drops into the left eye every 6 (six) hours. 10/07/22  Yes Jewelz Kobus-Warren, Sadie Haber, NP  omeprazole (PRILOSEC OTC) 20 MG tablet Take 20 mg by mouth daily.   Yes [provider]  losartan (COZAAR) 100 MG tablet TAKE ONE TABLET BY MOUTH ONE TIME DAILY 09/15/22    Donita Brooks, MD  traMADol (ULTRAM) 50 MG tablet Take 1 tablet (50 mg total) by mouth every 8 (eight) hours as needed. 07/22/22   Donita Brooks, MD  venlafaxine XR (EFFEXOR-XR) 75 MG 24 hr capsule TAKE 3 CAPSULES BY MOUTH DAILY WITH BREAKFAST 08/26/22   Donita Brooks, MD    Family History Family History  Problem Relation Age of Onset   Depression Mother    Hearing loss Mother    Hyperlipidemia Mother    Hypertension Mother    Miscarriages / [place] Mother    Diabetes Father    Hearing loss Father    Hyperlipidemia Father    Hypertension Father    Alcohol abuse Brother    Depression Brother    Drug abuse Brother    Depression Maternal Uncle    Arthritis Maternal Grandmother    Cancer Maternal Grandmother    Depression Maternal Grandmother    Hearing loss Maternal Grandmother    Heart disease Maternal Grandmother    Hypertension Maternal Grandmother    Depression Maternal Grandfather    Cancer Paternal Grandmother    Heart disease Paternal Grandmother    Alcohol abuse Paternal Grandfather    Cancer Paternal Grandfather    Heart disease Paternal Grandfather    Hyperlipidemia Paternal Grandfather    Hypertension Paternal Actor  Miscarriages / Stillbirths Paternal Grandfather        retinitis pigmentosis    Social History Social History   Tobacco Use   Smoking status: Former    Types: E-cigarettes    Quit date: 03/31/2014    Years since quitting: 8.5   Smokeless tobacco: Never  Vaping Use   Vaping Use: Never used  Substance Use Topics   Alcohol use: Yes   Drug use: No     Allergies   Patient has no known allergies.   Review of Systems Review of Systems Per HPI  Physical Exam Triage Vital Signs ED Triage Vitals  Enc Vitals Group     BP 10/07/22 1309 117/72     Pulse Rate 10/07/22 1309 71     Resp 10/07/22 1309 20     Temp 10/07/22 1309 98.7 F (37.1 C)     Temp Source 10/07/22 1309 Oral     SpO2 10/07/22 1309 97 %     Weight  --      Height --      Head Circumference --      Peak Flow --      Pain Score 10/07/22 1311 1     Pain Loc --      Pain Edu? --      Excl. in GC? --    No data found.  Updated Vital Signs BP 117/72 (BP Location: Right Arm)   Pulse 71   Temp 98.7 F (37.1 C) (Oral)   Resp 20   SpO2 97%   Visual Acuity Right Eye Distance: 20/16 Left Eye Distance: 20/16 Bilateral Distance: 20/16  Right Eye Near:   Left Eye Near:    Bilateral Near:     Physical Exam Vitals and nursing note reviewed.  Constitutional:      General: He is not in acute distress.    Appearance: Normal appearance.  HENT:     Head: Normocephalic.  Eyes:     General: Lids are normal. Lids are everted, no foreign bodies appreciated. Vision grossly intact. Gaze aligned appropriately. No visual field deficit.       Left eye: No foreign body, discharge or hordeolum.     Extraocular Movements: Extraocular movements intact.     Left eye: Normal extraocular motion and no nystagmus.     Conjunctiva/sclera: Conjunctivae normal.     Left eye: Left conjunctiva is not injected. No chemosis, exudate or hemorrhage.    Pupils: Pupils are equal, round, and reactive to light.     Comments: Eye Exam: Eyelids everted and swept for foreign body. The eye was anesthetized with 2 drops of Tetracaine and stained with fluorescein. Examination under woods lamp does reveal an area of increased stain uptake of the conjunctiva at the 6' o'clock position.  The eye was then irrigated copiously with saline.   Pulmonary:     Effort: Pulmonary effort is normal.  Skin:    General: Skin is warm and dry.  Neurological:     General: No focal deficit present.     Mental Status: He is alert and oriented to person, place, and time.  Psychiatric:        Mood and Affect: Mood normal.        Behavior: Behavior normal.      UC Treatments / Results  Labs (all labs ordered are listed, but only abnormal results are displayed) Labs Reviewed - No  data to display  EKG   Radiology No results found.  Procedures  Procedures (including critical care time)  Medications Ordered in UC Medications - No data to display   Initial Impression / Assessment and Plan / UC Course  I have reviewed the triage vital signs and the nursing notes.  Pertinent labs & imaging results that were available during my care of the patient were reviewed by me and considered in my medical decision making (see chart for details).  The patient is well-appearing, he is in no acute distress, vital signs are stable.  Fluorescein stain was performed, uptake was seen at the 6 o'clock position of the conjunctiva under the left lower eyelid.  Will treat with ofloxacin 0.3% solution 4 times daily for the next 5 days.  Supportive care recommendations were provided and discussed with the patient to include use of over-the-counter analgesics for pain or discomfort, warm compresses to the left eye for pain or discomfort, and cool compresses to help with swelling.  Patient was given strict indications of when to follow-up in the emergency department.  Patient was advised that he will need to follow-up with employee health and wellness in [place] on tomorrow.  Patient was given the information.  Patient is in agreement with this plan of care and verbalizes understanding.  All patient's questions were answered.  Patient stable for discharge.   Final Clinical Impressions(s) / UC Diagnoses   Final diagnoses:  Conjunctival abrasion, left, initial encounter     Discharge Instructions      Your last Tdap was in November 2020. Apply eyedrops as prescribed. May take over-the-counter Tylenol or ibuprofen as needed for pain, fever, general discomfort. May apply warm compresses to the left eye to help with pain or discomfort.  If you develop swelling, recommend using cool compresses. Do not rub or manipulate the eye while symptoms persist. Go to the emergency department  immediately if you experience sudden loss of vision, change in vision, or other concerns. You will need to go to [facility] [facility] [employer] [facility] in [place] tomorrow.  The address is [address], [address], [place]. Follow-up as needed.     ED Prescriptions     Medication Sig Dispense Auth. Provider   ofloxacin (OCUFLOX) 0.3 % ophthalmic solution  (Status: Discontinued) Place 2 drops into the left eye every 6 (six) hours for 5 days. 10 mL Charlottie Peragine-Warren, Sadie Haber, NP   ofloxacin (OCUFLOX) 0.3 % ophthalmic solution Place 2 drops into the left eye every 6 (six) hours. 5 mL Jovanie Verge-Warren, Sadie Haber, NP      PDMP not reviewed this encounter.   Abran Cantor, NP 10/07/22 1354    Abran Cantor, NP 10/07/22 1359    Sharanda Shinault-Warren, Sadie Haber, NP 10/07/22 1401    Kupono Marling-Warren, Sadie Haber, NP 10/07/22 1401

## 2022-10-07 NOTE — Discharge Instructions (Addendum)
Your last Tdap was in November 2020. Apply eyedrops as prescribed. May take over-the-counter Tylenol or ibuprofen as needed for pain, fever, general discomfort. May apply warm compresses to the left eye to help with pain or discomfort.  If you develop swelling, recommend using cool compresses. Do not rub or manipulate the eye while symptoms persist. Go to the emergency department immediately if you experience sudden loss of vision, change in vision, or other concerns. You will need to go to [facility] in [place] tomorrow.  The address is [address], [address], [place]. Follow-up as needed.

## 2022-10-07 NOTE — ED Triage Notes (Signed)
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  Tried to wash eye out at work.  Denies any vision changes.

## 2022-11-25 ENCOUNTER — Other Ambulatory Visit: Payer: Self-pay | Admitting: Family Medicine

## 2022-11-25 DIAGNOSIS — F331 Major depressive disorder, recurrent, moderate: Secondary | ICD-10-CM

## 2022-12-18 ENCOUNTER — Other Ambulatory Visit: Payer: Self-pay | Admitting: Family Medicine

## 2022-12-18 DIAGNOSIS — I10 Essential (primary) hypertension: Secondary | ICD-10-CM

## 2022-12-19 NOTE — Telephone Encounter (Signed)
Requested medication (s) are due for refill today: Yes  Requested medication (s) are on the active medication list: Yes  Last refill:  09/15/22 #90, 0RF  Future visit scheduled: No  Notes to clinic:  Unable to refill per protocol due to failed labs, no updated results.      Requested Prescriptions  Pending Prescriptions Disp Refills   losartan (COZAAR) 100 MG tablet [Pharmacy Med Name: Losartan Potassium Oral Tablet 100 MG] 90 tablet 0    Sig: TAKE ONE TABLET BY MOUTH ONCE A DAY     Cardiovascular:  Angiotensin Receptor Blockers Failed - 12/18/2022  9:57 AM      Failed - Cr in normal range and within 180 days    Creat  Date Value Ref Range Status  04/19/2021 0.97 0.60 - 1.26 mg/dL Final         Failed - K in normal range and within 180 days    Potassium  Date Value Ref Range Status  04/19/2021 4.6 3.5 - 5.3 mmol/L Final         Failed - Valid encounter within last 6 months    Recent Outpatient Visits           1 year ago Atypical chest pain   [facility] Donita Brooks, MD   1 year ago Benign essential HTN   [facility] Donita Brooks, MD   2 years ago Fever, unspecified fever cause   [facility] Tanya Nones, Priscille Heidelberg, MD   2 years ago Erroneous encounter - disregard   [facility] Donita Brooks, MD   3 years ago Chronic midline low back pain without sciatica   [facility] Pickard, Priscille Heidelberg, MD              Passed - Patient is not pregnant      Passed - Last BP in normal range    BP Readings from Last 1 Encounters:  10/07/22 117/72

## 2022-12-29 ENCOUNTER — Other Ambulatory Visit: Payer: Self-pay | Admitting: Family Medicine

## 2022-12-29 DIAGNOSIS — I10 Essential (primary) hypertension: Secondary | ICD-10-CM

## 2022-12-30 ENCOUNTER — Other Ambulatory Visit: Payer: Self-pay

## 2022-12-30 NOTE — Telephone Encounter (Signed)
Requested medications are due for refill today.  unsure  Requested medications are on the active medications list.  yes  Last refill. 07/22/2022 #21 0 rf  Future visit scheduled.   no  Notes to clinic.  Refill not delegated.    Requested Prescriptions  Pending Prescriptions Disp Refills   traMADol (ULTRAM) 50 MG tablet [Pharmacy Med Name: traMADol HCl Oral Tablet 50 MG] 21 tablet 0    Sig: TAKE 1 TABLET BY MOUTH EVERY 8 HOURS AS NEEDED     Not Delegated - Analgesics:  Opioid Agonists Failed - 12/29/2022 11:19 AM      Failed - This refill cannot be delegated      Failed - Urine Drug Screen completed in last 360 days      Failed - Valid encounter within last 3 months    Recent Outpatient Visits           1 year ago Atypical chest pain   [facility] Donita Brooks, MD   1 year ago Benign essential HTN   [facility] Donita Brooks, MD   2 years ago Fever, unspecified fever cause   [facility] Pickard, Priscille Heidelberg, MD   2 years ago Erroneous encounter - disregard   [facility] Donita Brooks, MD   3 years ago Chronic midline low back pain without sciatica   [facility] Pickard, Priscille Heidelberg, MD

## 2022-12-30 NOTE — Telephone Encounter (Signed)
Patient called to follow up on refills requested for   losartan (COZAAR) 100 MG tablet [[id number]]   traMADol (ULTRAM) 50 MG tablet [[id number]]   **Patient currently out of both medications** Pharmacy confirmed as:  [facility] [address], [place] - [address] [place] [address] Phone: [phone]  Fax: [phone]   Please advise at [phone].

## 2023-01-01 MED ORDER — TRAMADOL HCL 50 MG PO TABS: 50.0000 mg | ORAL_TABLET | Freq: Three times a day (TID) | ORAL | 0 refills | Status: DC | PRN

## 2023-01-01 MED ORDER — LOSARTAN POTASSIUM 100 MG PO TABS
100.0000 mg | ORAL_TABLET | Freq: Every day | ORAL | 0 refills | Status: DC
Start: 2023-01-01 — End: 2023-01-09

## 2023-01-09 ENCOUNTER — Encounter: Payer: Self-pay | Admitting: Family Medicine

## 2023-01-09 ENCOUNTER — Ambulatory Visit: Payer: BC Managed Care – PPO | Admitting: Family Medicine

## 2023-01-09 VITALS — BP 132/84 | HR 59 | Temp 98.4°F | Ht >= 80 in | Wt 363.0 lb

## 2023-01-09 DIAGNOSIS — S39012A Strain of muscle, fascia and tendon of lower back, initial encounter: Secondary | ICD-10-CM | POA: Diagnosis not present

## 2023-01-09 MED ORDER — PREDNISONE 20 MG PO TABS: ORAL_TABLET | ORAL | 0 refills | Status: DC

## 2023-01-09 MED ORDER — CARISOPRODOL 350 MG PO TABS: 350.0000 mg | ORAL_TABLET | Freq: Four times a day (QID) | ORAL | 0 refills | Status: DC | PRN

## 2023-01-09 NOTE — Progress Notes (Signed)
Subjective:    Patient ID: Roberto [name], male    DOB: 09-28-1990, 32 y.o.   MRN: [id number]  08/2019  Patient has questions regarding his Worker's Comp.  He injured his lower back in 2017.  He states that his [employer]. 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  He has questions regarding whether he should take this or not.  Per his report his MRI, nerve conduction studies, platelet rich plasma injections, RFA treatments, have provided him minimal improvement in his lower back.  He also occasionally has muscle spasms in his upper thoracic spine.  I explained to the patient that I do not believe his lower back injury would cause the muscle spasms in his thoracic spine.  I believe this is more likely due to posture and activity.  At the present time he states that the pain in his lower back has improved and he is no longer having neuropathic pain radiating down his legs.  I encouraged the patient to discuss his situation with his attorney however I do not feel that his previous injury from 2017 is causing the pain in his upper back.    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  He hyperextended his right leg and twisted his left lower back when he slipped.  Since that time he has had pain in his left lower back.  The pain is located roughly at the level of L5 in a bandlike fashion from the center of his back to his upper gluteus region.  The pain stays in that area.  It does not radiate.  He denies any numbness or tingling radiating down his leg.  He denies any weakness in his left leg.  His reflexes are normal in his upper leg.  Strength is normal in his left leg.  Movement makes the pain worse.  He has been taking ibuprofen and tramadol without relief. Past Medical History:  Diagnosis Date   Benign essential HTN    Depression    Fatty liver disease, nonalcoholic    H/O: pneumonia    as infant   HLD  (hyperlipidemia)    No past surgical history on file. Current Outpatient Medications on File Prior to Visit  Medication Sig Dispense Refill   losartan (COZAAR) 100 MG tablet TAKE ONE TABLET BY MOUTH ONCE A DAY 90 tablet 1   losartan (COZAAR) 100 MG tablet Take 1 tablet (100 mg total) by mouth daily. 90 tablet 0   omeprazole (PRILOSEC OTC) 20 MG tablet Take 20 mg by mouth daily.     REXULTI 1 MG TABS tablet Take 1 mg by mouth daily.     traMADol (ULTRAM) 50 MG tablet Take 1 tablet (50 mg total) by mouth every 8 (eight) hours as needed. 21 tablet 0   venlafaxine XR (EFFEXOR-XR) 75 MG 24 hr capsule Take 3 capsules by mouth daily with breakfast 270 capsule 1   No current facility-administered medications on file prior to visit.   No Known Allergies Social History   Socioeconomic History   Marital status: Married    Spouse name: Not on file   Number of children: Not on file   Years of education: Not on file   Highest education level: Associate degree: occupational, Scientist, product/process development, or vocational program  Occupational History   Not on file  Tobacco Use   Smoking status: Former  Types: E-cigarettes    Quit date: 03/31/2014    Years since quitting: 8.7   Smokeless tobacco: Never  Vaping Use   Vaping status: Never Used  Substance and Sexual Activity   Alcohol use: Yes   Drug use: No   Sexual activity: Never    Birth control/protection: Condom  Other Topics Concern   Not on file  Social History Narrative   Works Office manager.    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 [employer]   Social Determinants of [employer]: Low Risk  (01/09/2023)   Overall Financial Resource Strain (CARDIA)    Difficulty of Paying Living Expenses: Not hard at all  Food Insecurity: No Food Insecurity (01/09/2023)   Hunger Vital Sign    Worried About Running Out of Food in the Last Year: Never true    Ran Out of Food in the Last Year: Never true  Transportation Needs: No  Transportation Needs (01/09/2023)   PRAPARE - Administrator, Civil Service (Medical): No    Lack of Transportation (Non-Medical): No  Physical Activity: Sufficiently Active (01/09/2023)   Exercise Vital Sign    Days of Exercise per Week: 7 days    Minutes of Exercise per Session: 150+ min  Stress: No Stress Concern Present (01/09/2023)   [employer] of Occupational Health - Occupational Stress Questionnaire    Feeling of Stress : Only a little  Social Connections: Socially Isolated (01/09/2023)   Social Connection and Isolation Panel [NHANES]    Frequency of Communication with Friends and Family: Never    Frequency of Social Gatherings with Friends and Family: Once a week    Attends Religious Services: Never    Database administrator or Organizations: No    Attends Engineer, structural: Not on file    Marital Status: Married  Catering manager Violence: Not on file     Review of Systems  Musculoskeletal:  Positive for back pain.  All other systems reviewed and are negative.      Objective:   Physical Exam Vitals reviewed.  Constitutional:      General: He is not in acute distress.    Appearance: Normal appearance. He is not ill-appearing or toxic-appearing.  Cardiovascular:     Rate and Rhythm: Normal rate and regular rhythm.     Heart sounds: Normal heart sounds. No murmur heard. Pulmonary:     Effort: Pulmonary effort is normal. No respiratory distress.     Breath sounds: Normal breath sounds. No wheezing or rales.  Musculoskeletal:     Lumbar back: Tenderness present. No swelling, deformity, spasms or bony tenderness. Decreased range of motion. Negative right straight leg raise test and negative left straight leg raise test.       Back:  Neurological:     General: No focal deficit present.     Mental Status: He is alert and oriented to person, place, and time. Mental status is at baseline.     Motor: No weakness.     Gait: Gait normal.     Deep  Tendon Reflexes: Reflexes normal.           Assessment & Plan:  Strain of lumbar region, initial encounter I believe the patient pulled a muscle in his low back.  Anticipate 4-6 weeks for it to heal.  Recommended stretching and range of motion activities to keep her back limber up.  Use prednisone taper pack as an anti-inflammatory given that ibuprofen  has been ineffective.  Use Soma 350 mg every 6 hours as needed for muscle relaxer.

## 2023-01-30 ENCOUNTER — Telehealth: Payer: Self-pay | Admitting: Family Medicine

## 2023-01-30 ENCOUNTER — Other Ambulatory Visit: Payer: Self-pay | Admitting: Family Medicine

## 2023-01-30 MED ORDER — TRAMADOL HCL 50 MG PO TABS: 50.0000 mg | ORAL_TABLET | Freq: Three times a day (TID) | ORAL | 0 refills | Status: DC | PRN

## 2023-01-30 MED ORDER — CARISOPRODOL 350 MG PO TABS: 350.0000 mg | ORAL_TABLET | Freq: Four times a day (QID) | ORAL | 0 refills | Status: DC | PRN
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
## 2023-02-18 ENCOUNTER — Other Ambulatory Visit: Payer: Self-pay | Admitting: Family Medicine

## 2023-02-18 NOTE — Telephone Encounter (Signed)
Requested medication (s) are due for refill today: yes  Requested medication (s) are on the active medication list: yes    Last refill: 01/30/23  #21  0 refills  Future visit scheduled No  Notes to clinic:  Not delegated, please review.  Requested Prescriptions  Pending Prescriptions Disp Refills   traMADol (ULTRAM) 50 MG tablet [Pharmacy Med Name: TRAMADOL HCL 50 MG TABLET] 21 tablet 0    Sig: TAKE 1 TABLET BY MOUTH EVERY 8 HOURS AS NEEDED.     Not Delegated - Analgesics:  Opioid Agonists Failed - 02/18/2023 11:01 AM      Failed - This refill cannot be delegated      Failed - Urine Drug Screen completed in last 360 days      Failed - Valid encounter within last 3 months    Recent Outpatient Visits           1 year ago Atypical chest pain   [facility] Tanya Nones, Priscille Heidelberg, MD   1 year ago Benign essential HTN   [facility] Donita Brooks, MD   2 years ago Fever, unspecified fever cause   [facility] Tanya Nones Priscille Heidelberg, MD   2 years ago Erroneous encounter - disregard   [facility] Donita Brooks, MD   3 years ago Chronic midline low back pain without sciatica   [facility] Pickard, Priscille Heidelberg, MD

## 2023-03-31 ENCOUNTER — Other Ambulatory Visit: Payer: Self-pay | Admitting: Family Medicine

## 2023-04-01 ENCOUNTER — Other Ambulatory Visit: Payer: Self-pay

## 2023-04-01 ENCOUNTER — Telehealth: Payer: Self-pay

## 2023-04-01 DIAGNOSIS — F331 Major depressive disorder, recurrent, moderate: Secondary | ICD-10-CM

## 2023-04-01 DIAGNOSIS — I10 Essential (primary) hypertension: Secondary | ICD-10-CM

## 2023-04-01 MED ORDER — VENLAFAXINE HCL ER 75 MG PO CP24
75.0000 mg | ORAL_CAPSULE | Freq: Every day | ORAL | 1 refills | Status: DC
Start: 2023-04-01 — End: 2023-05-15

## 2023-04-01 MED ORDER — LOSARTAN POTASSIUM 100 MG PO TABS
100.0000 mg | ORAL_TABLET | Freq: Every day | ORAL | 1 refills | Status: DC
Start: 2023-04-01 — End: 2023-09-24

## 2023-04-01 NOTE — Telephone Encounter (Signed)
Copied from CRM [id number]. Topic: Clinical - Prescription Issue >> Apr 01, 2023 11:06 AM Fuller Mandril wrote: Reason for CRM: Pt called back because the pharmacy still does not have Rx requested.

## 2023-04-02 MED ORDER — TRAMADOL HCL 50 MG PO TABS: 50.0000 mg | ORAL_TABLET | Freq: Three times a day (TID) | ORAL | 0 refills | Status: DC | PRN

## 2023-04-08 ENCOUNTER — Encounter: Payer: Self-pay | Admitting: Emergency Medicine

## 2023-04-08 ENCOUNTER — Ambulatory Visit
Admission: EM | Admit: 2023-04-08 | Discharge: 2023-04-08 | Disposition: A | Discharge status: Home / Self Care | Payer: Worker's Compensation | Attending: Family Medicine | Admitting: Family Medicine

## 2023-04-08 DIAGNOSIS — S61213A Laceration without foreign body of left middle finger without damage to nail, initial encounter: Secondary | ICD-10-CM | POA: Diagnosis not present

## 2023-04-08 MED ORDER — CHLORHEXIDINE GLUCONATE 4 % EX SOLN: Freq: Every day | CUTANEOUS | 0 refills | Status: DC | PRN

## 2023-04-08 MED ORDER — CEPHALEXIN 500 MG PO CAPS: 500.0000 mg | ORAL_CAPSULE | Freq: Two times a day (BID) | ORAL | 0 refills | Status: DC

## 2023-04-08 MED ORDER — MUPIROCIN 2 % EX OINT: 1.0000 | TOPICAL_OINTMENT | Freq: Two times a day (BID) | CUTANEOUS | 0 refills | Status: DC

## 2023-04-08 MED ORDER — IBUPROFEN 800 MG PO TABS
800.0000 mg | ORAL_TABLET | Freq: Once | ORAL | Status: AC
  Administered 2023-04-08: 800 mg via ORAL

## 2023-04-08 NOTE — ED Triage Notes (Signed)
Laceration to left middle finger pad while removing a bolt today at work.  Bleeding controled.  States last tetanus was within the last 5 years.

## 2023-04-08 NOTE — ED Provider Notes (Signed)
[facility]    CSN: [id number] Arrival date & time: 04/08/23  1412      History   Chief Complaint No chief complaint on file.   HPI Roberto [name] is a 32 y.o. male.   Patient presenting today with a laceration to the left middle finger that occurred while removing a bolt at work today.  Denies uncontrolled bleeding, numbness, tingling, decreased range of motion, concern for fragments within the wound.  Has been keeping pressure on it since it happened, not taking anything for pain thus far.  States last tetanus shot was either in 2020 or 2021.    Past Medical History:  Diagnosis Date   Benign essential HTN    Depression    Fatty liver disease, nonalcoholic    H/O: pneumonia    as infant   HLD (hyperlipidemia)     Patient Active Problem List   Diagnosis Date Noted   Elevated LFTs 06/12/2014   Obesity 06/05/2014   Depression 06/05/2014   Obstructive sleep apnea 06/05/2014   Esophageal reflux 06/05/2014    History reviewed. No pertinent surgical history.     Home Medications    Prior to Admission medications   Medication Sig Start Date End Date Taking? Authorizing Provider  cephALEXin (KEFLEX) 500 MG capsule Take 1 capsule (500 mg total) by mouth 2 (two) times daily. 04/08/23  Yes Particia Nearing, PA-C  chlorhexidine (HIBICLENS) 4 % external liquid Apply topically daily as needed. 04/08/23  Yes Particia Nearing, PA-C  mupirocin ointment (BACTROBAN) 2 % Apply 1 Application topically 2 (two) times daily. 04/08/23  Yes Particia Nearing, PA-C  carisoprodol (SOMA) 350 MG tablet Take 1 tablet (350 mg total) by mouth 4 (four) times daily as needed for muscle spasms. 01/30/23   Donita Brooks, MD  losartan (COZAAR) 100 MG tablet Take 1 tablet (100 mg total) by mouth daily. 04/01/23   Donita Brooks, MD  omeprazole (PRILOSEC OTC) 20 MG tablet Take 20 mg by mouth daily.    [provider]  REXULTI 1 MG TABS tablet Take 1 mg by mouth  daily. 12/19/22   [provider]  traMADol (ULTRAM) 50 MG tablet Take 1 tablet (50 mg total) by mouth every 8 (eight) hours as needed. 04/02/23   Donita Brooks, MD  venlafaxine XR (EFFEXOR-XR) 75 MG 24 hr capsule Take 1 capsule (75 mg total) by mouth daily with breakfast. 04/01/23   Donita Brooks, MD    Family History Family History  Problem Relation Age of Onset   Depression Mother    Hearing loss Mother    Hyperlipidemia Mother    Hypertension Mother    Miscarriages / [place] Mother    Diabetes Father    Hearing loss Father    Hyperlipidemia Father    Hypertension Father    Alcohol abuse Brother    Depression Brother    Drug abuse Brother    Depression Maternal Uncle    Arthritis Maternal Grandmother    Cancer Maternal Grandmother    Depression Maternal Grandmother    Hearing loss Maternal Grandmother    Heart disease Maternal Grandmother    Hypertension Maternal Grandmother    Depression Maternal Grandfather    Cancer Paternal Grandmother    Heart disease Paternal Grandmother    Alcohol abuse Paternal Grandfather    Cancer Paternal Grandfather    Heart disease Paternal Grandfather    Hyperlipidemia Paternal Grandfather    Hypertension Paternal Actor  Miscarriages / Stillbirths Paternal Grandfather        retinitis pigmentosis    Social History Social History   Tobacco Use   Smoking status: Former    Types: E-cigarettes    Quit date: 03/31/2014    Years since quitting: 9.0   Smokeless tobacco: Never  Vaping Use   Vaping status: Never Used  Substance Use Topics   Alcohol use: Yes   Drug use: No     Allergies   Patient has no known allergies.   Review of Systems Review of Systems Per HPI  Physical Exam Triage Vital Signs ED Triage Vitals  Encounter Vitals Group     BP 04/08/23 1421 118/73     Systolic BP Percentile --      Diastolic BP Percentile --      Pulse Rate 04/08/23 1421 78     Resp 04/08/23 1421 20     Temp  04/08/23 1421 98 F (36.7 C)     Temp Source 04/08/23 1421 Oral     SpO2 04/08/23 1421 98 %     Weight --      Height --      Head Circumference --      Peak Flow --      Pain Score 04/08/23 1423 6     Pain Loc --      Pain Education --      Exclude from Growth Chart --    No data found.  Updated Vital Signs BP 118/73 (BP Location: Right Arm)   Pulse 78   Temp 98 F (36.7 C) (Oral)   Resp 20   SpO2 98%   Visual Acuity Right Eye Distance:   Left Eye Distance:   Bilateral Distance:    Right Eye Near:   Left Eye Near:    Bilateral Near:     Physical Exam Vitals and nursing note reviewed.  Constitutional:      Appearance: Normal appearance.  HENT:     Head: Atraumatic.     Mouth/Throat:     Mouth: Mucous membranes are moist.  Eyes:     Extraocular Movements: Extraocular movements intact.     Conjunctiva/sclera: Conjunctivae normal.  Cardiovascular:     Rate and Rhythm: Normal rate and regular rhythm.  Pulmonary:     Effort: Pulmonary effort is normal.     Breath sounds: Normal breath sounds.  Musculoskeletal:        General: Normal range of motion.     Cervical back: Normal range of motion and neck supple.  Skin:    General: Skin is warm.     Comments: 1.5 cm laceration to the distal aspect of the left middle finger.  Well-approximated in a neutral position.  Bleeding well-controlled.  No foreign body appreciable on thorough exam.  Subcutaneous tissue exposed with tension applied to the wound  Neurological:     General: No focal deficit present.     Mental Status: He is oriented to person, place, and time.     Motor: No weakness.     Gait: Gait normal.     Comments: Left hand neurovascularly intact  Psychiatric:        Mood and Affect: Mood normal.        Thought Content: Thought content normal.        Judgment: Judgment normal.      UC Treatments / Results  Labs (all labs ordered are listed, but only abnormal results are displayed) Labs Reviewed -  No data to display  EKG   Radiology No results found.  Procedures Procedures (including critical care time)  Medications Ordered in UC Medications  ibuprofen (ADVIL) tablet 800 mg (800 mg Oral Given 04/08/23 1517)    Initial Impression / Assessment and Plan / UC Course  I have reviewed the triage vital signs and the nursing notes.  Pertinent labs & imaging results that were available during my care of the patient were reviewed by me and considered in my medical decision making (see chart for details).     Options reviewed with patient, pressure dressing versus Dermabond versus suture.  It is well-approximated at rest, he is agreeable to pressure dressing at this time.  Offered finger splint for protection and keeping finger in a neutral position but he declines at this time.  Ibuprofen was given during time in clinic for pain.  Wound was soaked thoroughly, pressure dressing was applied.  Given depth will treat with Keflex, and good home wound care with mupirocin and Hibiclens, pressure dressings.  Tetanus up-to-date.  Return for worsening symptoms.  Final Clinical Impressions(s) / UC Diagnoses   Final diagnoses:  Laceration of left middle finger without foreign body without damage to nail, initial encounter     Discharge Instructions      Clean the area at least once a day with Hibiclens and apply the mupirocin ointment and a nonstick gauze pad.  Keep a pressure dressing on the area at all times until it is well-healed.  You may place a finger splint for comfort and to keep the finger neutral while it is healing.  Ice and elevate to help with swelling.  Ibuprofen and Tylenol for pain.    ED Prescriptions     Medication Sig Dispense Auth. Provider   cephALEXin (KEFLEX) 500 MG capsule Take 1 capsule (500 mg total) by mouth 2 (two) times daily. 14 capsule Particia Nearing, [place]   chlorhexidine (HIBICLENS) 4 % external liquid Apply topically daily as needed. 236 mL Particia Nearing, PA-C   mupirocin ointment (BACTROBAN) 2 % Apply 1 Application topically 2 (two) times daily. 22 g Particia Nearing, [place]      PDMP not reviewed this encounter.   Particia Nearing, [place] 04/08/23 1538

## 2023-04-08 NOTE — Discharge Instructions (Signed)
Clean the area at least once a day with Hibiclens and apply the mupirocin ointment and a nonstick gauze pad.  Keep a pressure dressing on the area at all times until it is well-healed.  You may place a finger splint for comfort and to keep the finger neutral while it is healing.  Ice and elevate to help with swelling.  Ibuprofen and Tylenol for pain.

## 2023-04-21 ENCOUNTER — Other Ambulatory Visit: Payer: Self-pay

## 2023-04-21 NOTE — Telephone Encounter (Signed)
Prescription Request  04/21/2023  LOV: 01/09/23  What is the name of the medication or equipment? REXULTI 1 MG TABS tablet [[id number]]   Have you contacted your pharmacy to request a refill? Yes   Which pharmacy would you like this sent to?  [id number] [place], [place] - [address] [place] [address] Phone: [phone] Fax: [phone]    Patient notified that their request is being sent to the clinical staff for review and that they should receive a response within 2 business days.   Please advise at [facility] [phone]

## 2023-04-21 NOTE — Telephone Encounter (Signed)
Requested medications are due for refill today.  unsure  Requested medications are on the active medications list.  yes  Last refill. 12/19/2022   Future visit scheduled.   no  Notes to clinic.  Medication is historical.    Requested Prescriptions  Pending Prescriptions Disp Refills   REXULTI 1 MG TABS tablet 30 tablet     Sig: Take 1 tablet (1 mg total) by mouth daily.     Off-Protocol Failed - 04/21/2023  5:05 PM      Failed - Medication not assigned to a protocol, review manually.      Failed - Valid encounter within last 12 months    Recent Outpatient Visits           1 year ago Atypical chest pain   [facility] Tanya Nones Priscille Heidelberg, MD   2 years ago Benign essential HTN   [facility] Tanya Nones, Priscille Heidelberg, MD   2 years ago Fever, unspecified fever cause   [facility] Tanya Nones, Priscille Heidelberg, MD   3 years ago Erroneous encounter - disregard   [facility] Donita Brooks, MD   3 years ago Chronic midline low back pain without sciatica   [facility] Pickard, Priscille Heidelberg, MD

## 2023-04-27 MED ORDER — REXULTI 1 MG PO TABS: 1.0000 mg | ORAL_TABLET | Freq: Every day | ORAL | 0 refills | Status: DC

## 2023-04-27 NOTE — Telephone Encounter (Signed)
Patient came to the office to follow up on refill requested for  REXULTI 1 MG TABS tablet   traMADol (ULTRAM) 50 MG tablet [[id number]]   **Last pill taken about 1 week ago**  Pharmacy confirmed as:  [id number] [place], [place] - [address] [place] [address] Phone: [phone]  Fax: [phone] DEA #: [id number]   Please advise patient at [phone].

## 2023-04-27 NOTE — Telephone Encounter (Signed)
Patient advised of courtesy refill; med refill appointment scheduled for 05/15/23.

## 2023-04-28 ENCOUNTER — Other Ambulatory Visit: Payer: Self-pay | Admitting: Family Medicine

## 2023-05-15 ENCOUNTER — Ambulatory Visit: Payer: 59 | Admitting: Family Medicine

## 2023-05-15 VITALS — BP 130/82 | HR 92 | Temp 98.3°F | Ht >= 80 in | Wt 394.0 lb

## 2023-05-15 DIAGNOSIS — I10 Essential (primary) hypertension: Secondary | ICD-10-CM | POA: Diagnosis not present

## 2023-05-15 DIAGNOSIS — F331 Major depressive disorder, recurrent, moderate: Secondary | ICD-10-CM | POA: Diagnosis not present

## 2023-05-15 MED ORDER — WEGOVY 0.5 MG/0.5ML ~~LOC~~ SOAJ: 0.5000 mg | SUBCUTANEOUS | 1 refills | Status: DC

## 2023-05-15 MED ORDER — VENLAFAXINE HCL ER 75 MG PO CP24: 150.0000 mg | ORAL_CAPSULE | Freq: Every day | ORAL | 3 refills | Status: DC

## 2023-05-15 NOTE — Progress Notes (Signed)
 Subjective:    Patient ID: Roberto [name], male    DOB: [birth year], 33 y.o.   MRN: [id number]  Patient is here today for checkup.  His blood pressure is well-controlled at 130/82.  He is due for fasting lab work.  His BMI is 42.  His weight is 394 pounds.  He would benefit from weight loss.  We did discuss Wegovy .  I wrote a prescription for the patient and sent to his pharmacy however I am doubtful that his insurance will cover this.  I explained the situation to him but he would still like to try.  He is still taking Rexulti .  He feels that he benefits from this medication.  It has helped his depression.  Depression is currently in remission.  He denies any suicidal thoughts or anhedonia.  However he continues to want to take the medication. Past Medical History:  Diagnosis Date   Benign essential HTN    Depression    Fatty liver disease, nonalcoholic    H/O: pneumonia    as infant   HLD (hyperlipidemia)    No past surgical history on file. Current Outpatient Medications on File Prior to Visit  Medication Sig Dispense Refill   carisoprodol  (SOMA ) 350 MG tablet Take 1 tablet (350 mg total) by mouth 4 (four) times daily as needed for muscle spasms. 30 tablet 0   cephALEXin  (KEFLEX ) 500 MG capsule Take 1 capsule (500 mg total) by mouth 2 (two) times daily. 14 capsule 0   chlorhexidine  (HIBICLENS ) 4 % external liquid Apply topically daily as needed. 236 mL 0   losartan  (COZAAR ) 100 MG tablet Take 1 tablet (100 mg total) by mouth daily. 90 tablet 1   mupirocin  ointment (BACTROBAN ) 2 % Apply 1 Application topically 2 (two) times daily. 22 g 0   omeprazole  (PRILOSEC OTC) 20 MG tablet Take 20 mg by mouth daily.     REXULTI  1 MG TABS tablet Take 1 tablet (1 mg total) by mouth daily. 30 tablet 0   traMADol  (ULTRAM ) 50 MG tablet TAKE 1 TABLET BY MOUTH EVERY 8 HOURS AS NEEDED. 21 tablet 0   venlafaxine  XR (EFFEXOR -XR) 75 MG 24 hr capsule Take 1 capsule (75 mg total) by mouth daily with breakfast. 270  capsule 1   No current facility-administered medications on file prior to visit.   No Known Allergies Social History   Socioeconomic History   Marital status: Married    Spouse name: Not on file   Number of children: Not on file   Years of education: Not on file   Highest education level: Associate degree: occupational, scientist, product/process development, or vocational program  Occupational History   Not on file  Tobacco Use   Smoking status: Former    Types: E-cigarettes    Quit date: 03/31/2014    Years since quitting: 9.1   Smokeless tobacco: Never  Vaping Use   Vaping status: Never Used  Substance and Sexual Activity   Alcohol use: Yes   Drug use: No   Sexual activity: Never    Birth control/protection: Condom  Other Topics Concern   Not on file  Social History Narrative   Works Office Manager.    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 [employer]   Social Drivers of [employer]: Low Risk  (05/11/2023)   Overall Financial Resource Strain (CARDIA)    Difficulty of Paying Living Expenses: Not hard at all  Food Insecurity: No Food Insecurity (05/11/2023)   Hunger  Vital Sign    Worried About Programme Researcher, Broadcasting/film/video in the Last Year: Never true    Ran Out of Food in the Last Year: Never true  Transportation Needs: No Transportation Needs (05/11/2023)   PRAPARE - Administrator, Civil Service (Medical): No    Lack of Transportation (Non-Medical): No  Physical Activity: Insufficiently Active (05/11/2023)   Exercise Vital Sign    Days of Exercise per Week: 2 days    Minutes of Exercise per Session: 10 min  Stress: Stress Concern Present (05/11/2023)   [employer] of Occupational Health - Occupational Stress Questionnaire    Feeling of Stress : To some extent  Social Connections: Socially Isolated (05/11/2023)   Social Connection and Isolation Panel [NHANES]    Frequency of Communication with Friends and Family: Once a week    Frequency of Social Gatherings  with Friends and Family: Never    Attends Religious Services: Never    Database Administrator or Organizations: No    Attends Engineer, Structural: Not on file    Marital Status: Married  Catering Manager Violence: Not on file     Review of Systems  Musculoskeletal:  Positive for back pain.  All other systems reviewed and are negative.      Objective:   Physical Exam Vitals reviewed.  Constitutional:      General: He is not in acute distress.    Appearance: Normal appearance. He is not ill-appearing or toxic-appearing.  Cardiovascular:     Rate and Rhythm: Normal rate and regular rhythm.     Heart sounds: Normal heart sounds. No murmur heard. Pulmonary:     Effort: Pulmonary effort is normal. No respiratory distress.     Breath sounds: Normal breath sounds. No wheezing or rales.  Neurological:     General: No focal deficit present.     Mental Status: He is alert and oriented to person, place, and time. Mental status is at baseline.     Motor: No weakness.     Gait: Gait normal.     Deep Tendon Reflexes: Reflexes normal.           Assessment & Plan:  Benign essential HTN - Plan: CBC with Differential/Platelet, COMPLETE METABOLIC PANEL WITH GFR, Lipid panel  Moderate episode of recurrent major depressive disorder (HCC) - Plan: venlafaxine  XR (EFFEXOR -XR) 75 MG 24 hr capsule Blood pressure is adequately controlled today.  Check a CBC and CMP and a lipid panel PIC LDL was 100 also make sure that there is no evidence of type 2 diabetes.  Recommended weight loss.  I sent a prescription for Wegovy  0.5 mg subcu weekly to his pharmacy.  We discussed the mechanism of action of this medication.  Patient is interested in trying this.  We will continue Effexor  150 mg daily and Rexulti  1 mg daily however thankfully his depression is currently in remission.

## 2023-05-16 LAB — COMPLETE METABOLIC PANEL WITH GFR
AG Ratio: 2.2 (calc) (ref 1.0–2.5)
ALT: 24 U/L (ref 9–46)
AST: 28 U/L (ref 10–40)
Albumin: 4.9 g/dL (ref 3.6–5.1)
Alkaline phosphatase (APISO): 46 U/L (ref 36–130)
BUN: 21 mg/dL (ref 7–25)
CO2: 24 mmol/L (ref 20–32)
Calcium: 9.6 mg/dL (ref 8.6–10.3)
Chloride: 110 mmol/L (ref 98–110)
Creat: 1.14 mg/dL (ref 0.60–1.26)
Globulin: 2.2 g/dL (ref 1.9–3.7)
Glucose, Bld: 84 mg/dL (ref 65–99)
Potassium: 4.4 mmol/L (ref 3.5–5.3)
Sodium: 144 mmol/L (ref 135–146)
Total Bilirubin: 0.5 mg/dL (ref 0.2–1.2)
Total Protein: 7.1 g/dL (ref 6.1–8.1)
eGFR: 87 mL/min/{1.73_m2} (ref >=60)

## 2023-05-16 LAB — CBC WITH DIFFERENTIAL/PLATELET
Absolute Lymphocytes: 2295 {cells}/uL (ref 850–3900)
Absolute Monocytes: 415 {cells}/uL (ref 200–950)
Basophils Absolute: 30 {cells}/uL (ref 0–200)
Basophils Relative: 0.6 %
Eosinophils Absolute: 80 {cells}/uL (ref 15–500)
Eosinophils Relative: 1.6 %
HCT: 43.9 % (ref 38.5–50.0)
Hemoglobin: 14.3 g/dL (ref 13.2–17.1)
MCH: 27.7 pg (ref 27.0–33.0)
MCHC: 32.6 g/dL (ref 32.0–36.0)
MCV: 84.9 fL (ref 80.0–100.0)
MPV: 10 fL (ref 7.5–12.5)
Monocytes Relative: 8.3 %
Neutro Abs: 2180 {cells}/uL (ref 1500–7800)
Neutrophils Relative %: 43.6 %
Platelets: 275 10*3/uL (ref 140–400)
RBC: 5.17 10*6/uL (ref 4.20–5.80)
RDW: 13.1 % (ref 11.0–15.0)
Total Lymphocyte: 45.9 %
WBC: 5 10*3/uL (ref 3.8–10.8)

## 2023-05-16 LAB — LIPID PANEL
Cholesterol: 213 mg/dL — ABNORMAL HIGH (ref <200)
HDL: 37 mg/dL — ABNORMAL LOW (ref >=40)
LDL Cholesterol (Calc): 152 mg/dL — ABNORMAL HIGH
Non-HDL Cholesterol (Calc): 176 mg/dL — ABNORMAL HIGH (ref <130)
Total CHOL/HDL Ratio: 5.8 (calc) — ABNORMAL HIGH (ref <5.0)
Triglycerides: 120 mg/dL (ref <150)

## 2023-05-24 ENCOUNTER — Other Ambulatory Visit: Payer: Self-pay | Admitting: Family Medicine

## 2023-07-13 ENCOUNTER — Telehealth: Admitting: Physician Assistant

## 2023-07-13 DIAGNOSIS — R519 Headache, unspecified: Secondary | ICD-10-CM | POA: Diagnosis not present

## 2023-07-13 MED ORDER — PREDNISONE 10 MG (21) PO TBPK
ORAL_TABLET | ORAL | 0 refills | Status: AC
Start: 2023-07-13 — End: ?

## 2023-07-13 MED ORDER — AMOXICILLIN-POT CLAVULANATE 875-125 MG PO TABS
1.0000 | ORAL_TABLET | Freq: Two times a day (BID) | ORAL | 0 refills | Status: DC
Start: 2023-07-13 — End: 2024-01-12

## 2023-07-13 NOTE — Progress Notes (Signed)
 Virtual Visit Consent   Roberto [name], you are scheduled for a virtual visit with a [facility] provider today. Just as with appointments in the office, your consent must be obtained to participate. Your consent will be active for this visit and any virtual visit you may have with one of our providers in the next 365 days. If you have a MyChart account, a copy of this consent can be sent to you electronically.  As this is a virtual visit, video technology does not allow for your provider to perform a traditional examination. This may limit your provider's ability to fully assess your condition. If your provider identifies any concerns that need to be evaluated in person or the need to arrange testing (such as labs, EKG, etc.), we will make arrangements to do so. Although advances in technology are sophisticated, we cannot ensure that it will always work on either your end or our end. If the connection with a video visit is poor, the visit may have to be switched to a telephone visit. With either a video or telephone visit, we are not always able to ensure that we have a secure connection.  By engaging in this virtual visit, you consent to the provision of healthcare and authorize for your insurance to be billed (if applicable) for the services provided during this visit. Depending on your insurance coverage, you may receive a charge related to this service.  I need to obtain your verbal consent now. Are you willing to proceed with your visit today? Daulton Harbaugh has provided verbal consent on 07/13/2023 for a virtual visit (video or telephone). Margaretann Loveless, PA-C  Date: 07/13/2023 8:25 AM   Virtual Visit via Video Note   I, Margaretann Loveless, connected with  Roberto [name]  ([id number], 08-01-1990) on 07/13/23 at  8:15 AM EDT by a video-enabled telemedicine application and verified that I am speaking with the correct person using two identifiers.  Location: Patient: Virtual Visit Location  Patient: Mobile Provider: Virtual Visit Location Provider: Home Office   I discussed the limitations of evaluation and management by telemedicine and the availability of in person appointments. The patient expressed understanding and agreed to proceed.    History of Present Illness: Roberto [name] is a 33 y.o. who identifies as a male who was assigned male at birth, and is being seen today for headache.  HPI: Headache  This is a new problem. The current episode started in the past 7 days (Wednesday, 07/08/23). The problem occurs constantly. The problem has been unchanged. The pain is located in the Retro-orbital and vertex region. The pain does not radiate. The pain quality is not similar to prior headaches. Quality: pressure. The pain is moderate. Associated symptoms include ear pain (occasionally feels like they need to pressurize), phonophobia, photophobia and sinus pressure. Pertinent negatives include no blurred vision, coughing, dizziness, eye pain, eye redness, fever, hearing loss, loss of balance, nausea, neck pain, numbness, sore throat, tingling, tinnitus, visual change or vomiting. The symptoms are aggravated by coughing (forward bending). He has tried Excedrin (ibuprofen, sudafed 24hr) for the symptoms. The treatment provided mild relief. His past medical history is significant for hypertension, migraine headaches and obesity.     Problems:  Patient Active Problem List   Diagnosis Date Noted   Elevated LFTs 06/12/2014   Obesity 06/05/2014   Depression 06/05/2014   Obstructive sleep apnea 06/05/2014   Esophageal reflux 06/05/2014    Allergies: No Known Allergies Medications:  Current Outpatient Medications:  amoxicillin-clavulanate (AUGMENTIN) 875-125 MG tablet, Take 1 tablet by mouth 2 (two) times daily., Disp: 14 tablet, Rfl: 0   predniSONE (STERAPRED UNI-PAK 21 TAB) 10 MG (21) TBPK tablet, 6 day taper; take as directed on package instructions, Disp: 21 tablet, Rfl: 0    losartan (COZAAR) 100 MG tablet, Take 1 tablet (100 mg total) by mouth daily., Disp: 90 tablet, Rfl: 1   omeprazole (PRILOSEC OTC) 20 MG tablet, Take 20 mg by mouth daily., Disp: , Rfl:    REXULTI 1 MG TABS tablet, TAKE 1 TABLET BY MOUTH EVERY DAY, Disp: 30 tablet, Rfl: 2   Semaglutide-Weight Management (WEGOVY) 0.5 MG/0.5ML SOAJ, Inject 0.5 mg into the skin once a week., Disp: 2 mL, Rfl: 1   traMADol (ULTRAM) 50 MG tablet, TAKE 1 TABLET BY MOUTH EVERY 8 HOURS AS NEEDED., Disp: 21 tablet, Rfl: 0   venlafaxine XR (EFFEXOR-XR) 75 MG 24 hr capsule, Take 2 capsules (150 mg total) by mouth daily with breakfast. Pt is taking 2 capsules once per day. 05/15/2023., Disp: 180 capsule, Rfl: 3  Observations/Objective: Patient is well-developed, well-nourished in no acute distress.  Resting comfortably  Head is normocephalic, atraumatic.  No labored breathing.  Speech is clear and coherent with logical content.  Patient is alert and oriented at baseline.    Assessment and Plan: 1. Sinus headache (Primary) - predniSONE (STERAPRED UNI-PAK 21 TAB) 10 MG (21) TBPK tablet; 6 day taper; take as directed on package instructions  Dispense: 21 tablet; Refill: 0 - amoxicillin-clavulanate (AUGMENTIN) 875-125 MG tablet; Take 1 tablet by mouth 2 (two) times daily.  Dispense: 14 tablet; Refill: 0  - Worsening symptoms that have not responded to OTC medications.  - Will give Augmentin for possible infection and Prednisone for inflammation and for headache lasting over 3 days - Continue allergy medications.  - Steam and humidifier can help - Stay well hydrated and get plenty of rest.  - Seek in person evaluation if no symptom improvement or if symptoms worsen   Follow Up Instructions: I discussed the assessment and treatment plan with the patient. The patient was provided an opportunity to ask questions and all were answered. The patient agreed with the plan and demonstrated an understanding of the instructions.  A  copy of instructions were sent to the patient via MyChart unless otherwise noted below.    The patient was advised to call back or seek an in-person evaluation if the symptoms worsen or if the condition fails to improve as anticipated.    Margaretann Loveless, PA-C

## 2023-07-13 NOTE — Patient Instructions (Signed)
 Roberto [name], thank you for joining Margaretann Loveless, PA-C for today's virtual visit.  While this provider is not your primary care provider (PCP), if your PCP is located in our provider database this encounter information will be shared with them immediately following your visit.   A [facility] MyChart account gives you access to today's visit and all your visits, tests, and labs performed at [facility] " click here if you don't have a [facility] MyChart account or go to [email or web address]  Consent: (Patient) Roberto [name] provided verbal consent for this virtual visit at the beginning of the encounter.  Current Medications:  Current Outpatient Medications:    amoxicillin-clavulanate (AUGMENTIN) 875-125 MG tablet, Take 1 tablet by mouth 2 (two) times daily., Disp: 14 tablet, Rfl: 0   predniSONE (STERAPRED UNI-PAK 21 TAB) 10 MG (21) TBPK tablet, 6 day taper; take as directed on package instructions, Disp: 21 tablet, Rfl: 0   losartan (COZAAR) 100 MG tablet, Take 1 tablet (100 mg total) by mouth daily., Disp: 90 tablet, Rfl: 1   omeprazole (PRILOSEC OTC) 20 MG tablet, Take 20 mg by mouth daily., Disp: , Rfl:    REXULTI 1 MG TABS tablet, TAKE 1 TABLET BY MOUTH EVERY DAY, Disp: 30 tablet, Rfl: 2   Semaglutide-Weight Management (WEGOVY) 0.5 MG/0.5ML SOAJ, Inject 0.5 mg into the skin once a week., Disp: 2 mL, Rfl: 1   traMADol (ULTRAM) 50 MG tablet, TAKE 1 TABLET BY MOUTH EVERY 8 HOURS AS NEEDED., Disp: 21 tablet, Rfl: 0   venlafaxine XR (EFFEXOR-XR) 75 MG 24 hr capsule, Take 2 capsules (150 mg total) by mouth daily with breakfast. Pt is taking 2 capsules once per day. 05/15/2023., Disp: 180 capsule, Rfl: 3   Medications ordered in this encounter:  Meds ordered this encounter  Medications   predniSONE (STERAPRED UNI-PAK 21 TAB) 10 MG (21) TBPK tablet    Sig: 6 day taper; take as directed on package instructions    Dispense:  21 tablet    Refill:  0    Supervising  Provider:   Merrilee Jansky [[id number]]   amoxicillin-clavulanate (AUGMENTIN) 875-125 MG tablet    Sig: Take 1 tablet by mouth 2 (two) times daily.    Dispense:  14 tablet    Refill:  0    Supervising Provider:   Merrilee Jansky [[id number]]     *If you need refills on other medications prior to your next appointment, please contact your pharmacy*  Follow-Up: Call back or seek an in-person evaluation if the symptoms worsen or if the condition fails to improve as anticipated.  [facility] [phone]  Other Instructions Sinus Pain  Sinus pain may occur when your sinuses become clogged or swollen. Sinuses are air-filled spaces in your skull that are behind the bones of your face and forehead. Sinus pain can range from mild to severe. What are the causes? Sinus pain can result from various conditions that affect the sinuses. Common causes include: Colds. Sinus infections. Allergies. What are the signs or symptoms? The main symptom of this condition is pain or pressure in your face, forehead, ears, or upper teeth. People who have sinus pain often have other symptoms, such as: Congested or runny nose. Fever. Inability to smell. Headache. Weather changes can make symptoms worse. How is this diagnosed? Your health care provider will diagnose this condition based on your symptoms and a physical exam. If you have pain that keeps coming back or does not go away,  your health care provider may recommend more testing. This may include: Imaging tests, such as a CT scan or MRI, to check for problems with your sinuses. Examination of your sinuses using a thin tool with a camera that is inserted through your nose (endoscopy). How is this treated? Treatment for this condition depends on the cause. Sinus pain that is caused by a sinus infection may be treated with antibiotic medicine. Sinus pain that is caused by congestion may be helped by rinsing out (flushing) the nose and  sinuses with saline solution. Sinus pain that is caused by allergies may be helped by allergy medicines (antihistamines) and medicated nasal sprays. Sinus surgery may be needed in some cases if other treatments do not help. Follow these instructions at home: General instructions If directed: Apply a warm, moist washcloth to your face to help relieve pain. Use a nasal saline wash. Follow the directions on the bottle or box. Hydrate and humidify Drink enough water to keep your urine clear or pale yellow. Staying hydrated will help to thin your mucus. Use a humidifier if your home is dry. Inhale steam for 10-15 minutes, 3-4 times a day or as told by your health care provider. You can do this in the bathroom while a hot shower is running. Limit your exposure to cool or dry air. Medicines  Take over-the-counter and prescription medicines only as told by your health care provider. If you were prescribed an antibiotic medicine, take it as told by your health care provider. Do not stop taking the antibiotic even if you start to feel better. If you have congestion, use a nasal spray to help lessen pressure. Contact a health care provider if: You have sinus pain more than one time a week. You have sensitivity to light or sound. You develop a fever. You feel nauseous or you vomit. Your sinus pain or headache does not get better with treatment. Get help right away if: You have vision problems. You have sudden, severe pain in your face or head. You have a seizure. You are confused. You have a stiff neck. Summary Sinus pain occurs when your sinuses become clogged or swollen. Sinus pain can result from various conditions that affect the sinuses, such as a cold, a sinus infection, or an allergy. Treatment for this condition depends on the cause. It may include medicine, such as antibiotics or antihistamines. This information is not intended to replace advice given to you by your health care  provider. Make sure you discuss any questions you have with your health care provider. Document Revised: 03/24/2021 Document Reviewed: 03/24/2021 Elsevier Patient Education  2024 Elsevier Inc.   If you have been instructed to have an in-person evaluation today at a local Urgent Care facility, please use the link below. It will take you to a list of all of our available [facility], including address, phone number and hours of operation. Please do not delay care.  [facility]  If you or a family member do not have a primary care provider, use the link below to schedule a visit and establish care. When you choose a [facility] primary care physician or advanced practice provider, you gain a long-term partner in health. Find a Primary Care Provider  Learn more about [facility] in-office and virtual care options: [facility] - Get Care Now

## 2023-07-27 ENCOUNTER — Other Ambulatory Visit: Payer: Self-pay | Admitting: Family Medicine

## 2023-07-27 MED ORDER — TRAMADOL HCL 50 MG PO TABS: 50.0000 mg | ORAL_TABLET | Freq: Three times a day (TID) | ORAL | 0 refills | Status: DC | PRN

## 2023-09-23 ENCOUNTER — Other Ambulatory Visit: Payer: Self-pay | Admitting: Family Medicine

## 2023-09-23 DIAGNOSIS — I10 Essential (primary) hypertension: Secondary | ICD-10-CM

## 2023-09-24 NOTE — Telephone Encounter (Signed)
 Requested Prescriptions  Pending Prescriptions Disp Refills   losartan  (COZAAR ) 100 MG tablet [Pharmacy Med Name: LOSARTAN  POTASSIUM 100 MG TAB] 90 tablet 0    Sig: TAKE 1 TABLET BY MOUTH EVERY DAY     Cardiovascular:  Angiotensin Receptor Blockers Failed - 09/24/2023 12:19 PM      Failed - Valid encounter within last 6 months    Recent Outpatient Visits           4 months ago Benign essential HTN   [facility] Austine Lefort, MD   8 months ago Strain of lumbar region, initial encounter   [place] [facility] Austine Lefort, MD   1 year ago Moderate episode of recurrent major depressive disorder [facility])   [place] Austine Lefort, MD   1 year ago Pain of finger of right hand   [place] [facility] Austine Lefort, MD   1 year ago Moderate episode of recurrent major depressive disorder [facility])   [place] [facility] Pickard, Cisco Crest, MD              Passed - Cr in normal range and within 180 days    Creat  Date Value Ref Range Status  05/15/2023 1.14 0.60 - 1.26 mg/dL Final         Passed - K in normal range and within 180 days    Potassium  Date Value Ref Range Status  05/15/2023 4.4 3.5 - 5.3 mmol/L Final         Passed - Patient is not pregnant      Passed - Last BP in normal range    BP Readings from Last 1 Encounters:  05/15/23 130/82

## 2024-01-05 ENCOUNTER — Telehealth: Payer: Self-pay | Admitting: Family Medicine

## 2024-01-05 NOTE — Telephone Encounter (Signed)
 Prescription Request  01/05/2024  LOV: 05/15/2023  What is the name of the medication or equipment? losartan  (COZAAR ) 100 MG tablet   Have you contacted your pharmacy to request a refill? Yes   Which pharmacy would you like this sent to?  [id number] [address], [place] - [address] [place] [address] Phone: [phone] Fax: [phone]    Patient notified that their request is being sent to the clinical staff for review and that they should receive a response within 2 business days.   Please advise at [facility] [phone]

## 2024-01-12 ENCOUNTER — Encounter: Payer: Self-pay | Admitting: Family Medicine

## 2024-01-12 ENCOUNTER — Ambulatory Visit: Admitting: Family Medicine

## 2024-01-12 DIAGNOSIS — I10 Essential (primary) hypertension: Secondary | ICD-10-CM

## 2024-01-12 MED ORDER — LOSARTAN POTASSIUM 100 MG PO TABS
100.0000 mg | ORAL_TABLET | Freq: Every day | ORAL | 3 refills | Status: AC
Start: 2024-01-12 — End: ?

## 2024-01-12 NOTE — Progress Notes (Signed)
 Subjective:    Patient ID: Roberto [name], male    DOB: 1990/12/27, 33 y.o.   MRN: [id number]  Patient is here today to get a refill of his medication.  He is no longer taking omeprazole  for the.  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  He has lost some weight.  He is taking semaglutide  on desired.  Recently has had 3 separate episodes separated in time where he experienced severe nausea and vomiting, wet barracks, and profound diarrhea for approximately 24 hours.  He is uncertain if this corresponded with taking a dose of his semaglutide .  Past Medical History:  Diagnosis Date   Benign essential HTN    Depression    Fatty liver disease, nonalcoholic    H/O: pneumonia    as infant   HLD (hyperlipidemia)    No past surgical history on file. Current Outpatient Medications on File Prior to Visit  Medication Sig Dispense Refill   losartan  (COZAAR ) 100 MG tablet TAKE 1 TABLET BY MOUTH EVERY DAY 90 tablet 0   omeprazole  (PRILOSEC OTC) 20 MG tablet Take 20 mg by mouth daily.     predniSONE  (STERAPRED UNI-PAK 21 TAB) 10 MG (21) TBPK tablet 6 day taper; take as directed on package instructions 21 tablet 0   REXULTI  1 MG TABS tablet TAKE 1 TABLET BY MOUTH EVERY DAY 30 tablet 2   traMADol  (ULTRAM ) 50 MG tablet Take 1 tablet (50 mg total) by mouth every 8 (eight) hours as needed. 21 tablet 0   venlafaxine  XR (EFFEXOR -XR) 75 MG 24 hr capsule Take 2 capsules (150 mg total) by mouth daily with breakfast. Pt is taking 2 capsules once per day. 05/15/2023. 180 capsule 3   No current facility-administered medications on file prior to visit.     No Known Allergies Social History   Socioeconomic History   Marital status: Married    Spouse name: Not on file   Number of children: Not on file   Years of education: Not on file   Highest education level: Associate degree: occupational, Scientist, product/process development, or vocational program  Occupational History   Not on file  Tobacco Use   Smoking  status: Former    Types: E-cigarettes    Quit date: 03/31/2014    Years since quitting: 9.7   Smokeless tobacco: Never  Vaping Use   Vaping status: Never Used  Substance and Sexual Activity   Alcohol use: Yes   Drug use: No   Sexual activity: Never    Birth control/protection: Condom  Other Topics Concern   Not on file  Social History Narrative   Works Office manager.    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 [employer]   Social Drivers of [employer]: Low Risk  (01/06/2024)   Overall Financial Resource Strain (CARDIA)    Difficulty of Paying Living Expenses: Not hard at all  Food Insecurity: No Food Insecurity (01/06/2024)   Hunger Vital Sign    Worried About Running Out of Food in the Last Year: Never true    Ran Out of Food in the Last Year: Never true  Transportation Needs: No Transportation Needs (01/06/2024)   PRAPARE - Administrator, Civil Service (Medical): No    Lack of Transportation (Non-Medical): No  Physical Activity: Inactive (01/06/2024)   Exercise Vital Sign    Days of Exercise per Week: 0 days    Minutes of Exercise per Session: Not on file  Stress: No Stress Concern Present (01/06/2024)   [employer] of Occupational Health - Occupational Stress Questionnaire    Feeling of Stress: Not at all  Social Connections: Socially Isolated (01/06/2024)   Social Connection and Isolation Panel    Frequency of Communication with Friends and Family: Never    Frequency of Social Gatherings with Friends and Family: Never    Attends Religious Services: Never    Database administrator or Organizations: No    Attends Engineer, structural: Not on file    Marital Status: Married  Catering manager Violence: Not on file     Review of Systems  Musculoskeletal:  Positive for back pain.  All other systems reviewed and are negative.      Objective:   Physical Exam Vitals reviewed.  Constitutional:      General: He is  not in acute distress.    Appearance: Normal appearance. He is not ill-appearing or toxic-appearing.  Cardiovascular:     Rate and Rhythm: Normal rate and regular rhythm.     Heart sounds: Normal heart sounds. No murmur heard. Pulmonary:     Effort: Pulmonary effort is normal. No respiratory distress.     Breath sounds: Normal breath sounds. No wheezing or rales.  Neurological:     General: No focal deficit present.     Mental Status: He is alert and oriented to person, place, and time. Mental status is at baseline.     Motor: No weakness.     Gait: Gait normal.     Deep Tendon Reflexes: Reflexes normal.           Assessment & Plan:  Benign essential HTN - Plan: losartan  (COZAAR ) 100 MG tablet, Comprehensive metabolic panel with GFR, LDL Cholesterol, Direct, CANCELED: Lipid panel Blood pressure today is excellent.  I will check his cholesterol.  His cholesterol in January was very bad.  I recommended at least taking fish oil 2000 mg a day.  He is interested to see if his cholesterol has improved with weight loss.  I believe that his GI side effects are likely due to his somatically tied.  I recommended that he monitor when these occur and see if it is related to when he takes the semaglutide .

## 2024-01-13 LAB — COMPREHENSIVE METABOLIC PANEL WITH GFR
AG Ratio: 1.9 (calc) (ref 1.0–2.5)
ALT: 26 U/L (ref 9–46)
AST: 29 U/L (ref 10–40)
Albumin: 4.5 g/dL (ref 3.6–5.1)
Alkaline phosphatase (APISO): 51 U/L (ref 36–130)
BUN: 24 mg/dL (ref 7–25)
CO2: 27 mmol/L (ref 20–32)
Calcium: 9.6 mg/dL (ref 8.6–10.3)
Chloride: 109 mmol/L (ref 98–110)
Creat: 1.14 mg/dL (ref 0.60–1.26)
Globulin: 2.4 g/dL (ref 1.9–3.7)
Glucose, Bld: 110 mg/dL — ABNORMAL HIGH (ref 65–99)
Potassium: 4.5 mmol/L (ref 3.5–5.3)
Sodium: 142 mmol/L (ref 135–146)
Total Bilirubin: 0.5 mg/dL (ref 0.2–1.2)
Total Protein: 6.9 g/dL (ref 6.1–8.1)
eGFR: 86 mL/min/1.73m2 (ref >=60)

## 2024-01-13 LAB — LDL CHOLESTEROL, DIRECT: Direct LDL: 150 mg/dL — ABNORMAL HIGH (ref <100)

## 2024-01-14 ENCOUNTER — Ambulatory Visit: Payer: Self-pay | Admitting: Family Medicine

## 2024-01-28 ENCOUNTER — Other Ambulatory Visit

## 2024-01-28 DIAGNOSIS — I10 Essential (primary) hypertension: Secondary | ICD-10-CM

## 2024-01-28 DIAGNOSIS — R7989 Other specified abnormal findings of blood chemistry: Secondary | ICD-10-CM

## 2024-01-28 DIAGNOSIS — E669 Obesity, unspecified: Secondary | ICD-10-CM

## 2024-01-28 LAB — CBC WITH DIFFERENTIAL/PLATELET
Absolute Lymphocytes: 2045 {cells}/uL (ref 850–3900)
Absolute Monocytes: 399 {cells}/uL (ref 200–950)
Basophils Absolute: 42 {cells}/uL (ref 0–200)
Basophils Relative: 1 %
Eosinophils Absolute: 80 {cells}/uL (ref 15–500)
Eosinophils Relative: 1.9 %
HCT: 46.7 % (ref 38.5–50.0)
Hemoglobin: 15.3 g/dL (ref 13.2–17.1)
MCH: 28.4 pg (ref 27.0–33.0)
MCHC: 32.8 g/dL (ref 32.0–36.0)
MCV: 86.8 fL (ref 80.0–100.0)
MPV: 9.8 fL (ref 7.5–12.5)
Monocytes Relative: 9.5 %
Neutro Abs: 1634 {cells}/uL (ref 1500–7800)
Neutrophils Relative %: 38.9 %
Platelets: 237 Thousand/uL (ref 140–400)
RBC: 5.38 Million/uL (ref 4.20–5.80)
RDW: 13.5 % (ref 11.0–15.0)
Total Lymphocyte: 48.7 %
WBC: 4.2 Thousand/uL (ref 3.8–10.8)

## 2024-01-28 LAB — LIPID PANEL
Cholesterol: 200 mg/dL — ABNORMAL HIGH (ref <200)
HDL: 40 mg/dL (ref >=40)
LDL Cholesterol (Calc): 142 mg/dL — ABNORMAL HIGH
Non-HDL Cholesterol (Calc): 160 mg/dL — ABNORMAL HIGH (ref <130)
Total CHOL/HDL Ratio: 5 (calc) — ABNORMAL HIGH (ref <5.0)
Triglycerides: 79 mg/dL (ref <150)

## 2024-01-29 ENCOUNTER — Ambulatory Visit: Payer: Self-pay | Admitting: Family Medicine

## 2024-01-30 ENCOUNTER — Encounter: Payer: Self-pay | Admitting: Family Medicine

## 2024-03-07 ENCOUNTER — Emergency Department (HOSPITAL_COMMUNITY)
Admission: EM | Admit: 2024-03-07 | Discharge: 2024-03-07 | Disposition: A | Discharge status: Home / Self Care | Payer: Worker's Compensation | Attending: Emergency Medicine | Admitting: Emergency Medicine

## 2024-03-07 ENCOUNTER — Emergency Department (HOSPITAL_COMMUNITY)

## 2024-03-07 ENCOUNTER — Other Ambulatory Visit: Payer: Self-pay

## 2024-03-07 ENCOUNTER — Encounter (HOSPITAL_COMMUNITY): Payer: Self-pay

## 2024-03-07 DIAGNOSIS — W268XXA Contact with other sharp object(s), not elsewhere classified, initial encounter: Secondary | ICD-10-CM | POA: Diagnosis not present

## 2024-03-07 DIAGNOSIS — I10 Essential (primary) hypertension: Secondary | ICD-10-CM | POA: Diagnosis not present

## 2024-03-07 DIAGNOSIS — Z23 Encounter for immunization: Secondary | ICD-10-CM | POA: Insufficient documentation

## 2024-03-07 DIAGNOSIS — S61220A Laceration with foreign body of right index finger without damage to nail, initial encounter: Secondary | ICD-10-CM | POA: Diagnosis present

## 2024-03-07 MED ORDER — CEPHALEXIN 500 MG PO CAPS
500.0000 mg | ORAL_CAPSULE | Freq: Once | ORAL | Status: AC
Start: 2024-03-07 — End: 2024-03-07
  Administered 2024-03-07: 500 mg via ORAL
  Filled 2024-03-07: qty 1

## 2024-03-07 MED ORDER — CEPHALEXIN 500 MG PO CAPS: 500.0000 mg | ORAL_CAPSULE | Freq: Three times a day (TID) | ORAL | 0 refills | Status: AC

## 2024-03-07 MED ORDER — DOUBLE ANTIBIOTIC 500-10000 UNIT/GM EX OINT
TOPICAL_OINTMENT | Freq: Once | CUTANEOUS | Status: AC
  Filled 2024-03-07: qty 1

## 2024-03-07 MED ORDER — TETANUS-DIPHTH-ACELL PERTUSSIS 5-2-15.5 LF-MCG/0.5 IM SUSP
0.5000 mL | Freq: Once | INTRAMUSCULAR | Status: AC
  Administered 2024-03-07: 0.5 mL via INTRAMUSCULAR
  Filled 2024-03-07: qty 0.5

## 2024-03-07 MED ORDER — LIDOCAINE HCL (PF) 1 % IJ SOLN
30.0000 mL | Freq: Once | INTRAMUSCULAR | Status: AC
  Administered 2024-03-07: 8 mL
  Filled 2024-03-07: qty 30

## 2024-03-07 NOTE — Discharge Instructions (Signed)
 Please follow-up closely with orthopedics on an outpatient basis.  Please call to make an appointment.  Return to emergency department immediately for any new or worsening symptoms.  Sutures need to be removed in 7 to 10 days.

## 2024-03-07 NOTE — ED Provider Triage Note (Signed)
 Emergency Medicine Provider Triage Evaluation Note  Roberto [name] , a 33 y.o. male  was evaluated in triage.  Pt complains of index finger laceration, sent from urgent care for evaluation of possible tendon injury due to depth of the wound, patient states he is able to flex and extend finger prior to being sent over..  Review of Systems  Positive: Laceration Negative: Fever  Physical Exam  BP 135/88 (BP Location: Right Arm)   Pulse 85   Temp 98.6 F (37 C) (Oral)   Resp 16   Ht 6' 9 (2.057 m)   Wt (!) 162.8 kg   SpO2 99%   BMI 38.46 kg/m  Gen:   Awake, no distress   Resp:  Normal effort  MSK:   Moves extremities without difficulty  Other:    Medical Decision Making  Medically screening exam initiated at 3:25 PM.  Appropriate orders placed.  Roberto [name] was informed that the remainder of the evaluation will be completed by another provider, this initial triage assessment does not replace that evaluation, and the importance of remaining in the ED until their evaluation is complete.     Roberto [name] LABOR, [place] 03/07/24 1526

## 2024-03-07 NOTE — ED Triage Notes (Signed)
 Pt arrived via POV from urgent care for further evaluation of a laceration to the middle of his right index finger from a rusty piece of metal.

## 2024-03-07 NOTE — ED Provider Notes (Signed)
 [place] EMERGENCY DEPARTMENT AT [facility] Provider Note   CSN: [id number] Arrival date & time: 03/07/24  1349     Patient presents with: Laceration   Roberto [name] is a 33 y.o. male.  {Add pertinent medical, surgical, social history, OB history to HPI:32947}  Laceration      Prior to Admission medications   Medication Sig Start Date End Date Taking? Authorizing Provider  losartan  (COZAAR ) 100 MG tablet Take 1 tablet (100 mg total) by mouth daily. 01/12/24   Duanne Butler DASEN, MD  omeprazole  (PRILOSEC OTC) 20 MG tablet Take 20 mg by mouth daily.    [provider]  predniSONE  (STERAPRED UNI-PAK 21 TAB) 10 MG (21) TBPK tablet 6 day taper; take as directed on package instructions 07/13/23   Vivienne Delon HERO, PA-C  REXULTI  1 MG TABS tablet TAKE 1 TABLET BY MOUTH EVERY DAY 05/25/23   Duanne Butler DASEN, MD  SEMAGLUTIDE , 2 MG/DOSE, [place] Inject 2 mg into the skin once a week.    [provider]  traMADol  (ULTRAM ) 50 MG tablet Take 1 tablet (50 mg total) by mouth every 8 (eight) hours as needed. 07/27/23   Duanne Butler DASEN, MD  venlafaxine  XR (EFFEXOR -XR) 75 MG 24 hr capsule Take 2 capsules (150 mg total) by mouth daily with breakfast. Pt is taking 2 capsules once per day. 05/15/2023. 05/15/23   Duanne Butler DASEN, MD    Allergies: Patient has no known allergies.    Review of Systems  Updated Vital Signs BP 135/88 (BP Location: Right Arm)   Pulse 85   Temp 98.6 F (37 C) (Oral)   Resp 16   Ht 6' 9 (2.057 m)   Wt (!) 162.8 kg   SpO2 99%   BMI 38.46 kg/m   Physical Exam  (all labs ordered are listed, but only abnormal results are displayed) Labs Reviewed - No data to display  EKG: None  Radiology: No results found.  {Document cardiac monitor, telemetry assessment procedure when appropriate:32947} .Laceration Repair  Date/Time: 03/07/2024 4:59 PM  Performed by: Suellen Sherran LABOR, PA-C Authorized by: Suellen Sherran LABOR, PA-C   Consent:     Consent obtained:  Verbal   Consent given by:  Patient   Risks discussed:  Infection, pain, retained foreign body, need for additional repair, poor cosmetic result, tendon damage, nerve damage, poor wound healing and vascular damage Universal protocol:    Patient identity confirmed:  Verbally with patient Anesthesia:    Anesthesia method: digital block 8 cc 1% lidocaine without epi. Laceration details:    Location:  Finger   Finger location:  R index finger   Length (cm):  4 Pre-procedure details:    Preparation:  Imaging obtained to evaluate for foreign bodies Exploration:    Hemostasis achieved with:  Tourniquet   Imaging obtained: x-ray     Imaging outcome: foreign body noted     Wound exploration: wound explored through full range of motion and entire depth of wound visualized     Wound extent: foreign bodies/material     Wound extent: no underlying fracture and no vascular damage   Treatment:    Area cleansed with:  Povidone-iodine   Amount of cleaning:  Extensive   Irrigation solution:  Sterile water and sterile saline   Irrigation volume:  1000 cc   Irrigation method:  Syringe   Visualized foreign bodies/material removed: yes     Debridement:  None   Undermining:  None Skin repair:  Repair method:  Sutures   Suture size:  4-0   Suture material:  Prolene   Suture technique:  Simple interrupted   Number of sutures:  10 Approximation:    Approximation:  Close Repair type:    Repair type:  Intermediate Post-procedure details:    Dressing:  Antibiotic ointment and splint for protection   Procedure completion:  Tolerated well, no immediate complications    Medications Ordered in the ED  lidocaine (PF) (XYLOCAINE) 1 % injection 30 mL (has no administration in time range)  Tdap (ADACEL) injection 0.5 mL (has no administration in time range)      {Click here for ABCD2, HEART and other calculators REFRESH Note before signing:1}                              Medical  Decision Making differential diagnosis includes but not limited to fracture, sprain, laceration, tendon injury, foreign body retention,  Amount and/or Complexity of Data Reviewed Radiology: ordered.  Risk OTC drugs. Prescription drug management.   ***  {Document critical care time when appropriate  Document review of labs and clinical decision tools ie CHADS2VASC2, etc  Document your independent review of radiology images and any outside records  Document your discussion with family members, caretakers and with consultants  Document social determinants of health affecting pt's care  Document your decision making why or why not admission, treatments were needed:32947:::1}   Final diagnoses:  None    ED Discharge Orders     None

## 2024-03-07 NOTE — ED Provider Notes (Signed)
 Patient was signed out to myself at shift change pending repeat x-ray of his finger post laceration repair and irrigation.  He did present with laceration to the index finger following an injury with a rusty piece of metal.  He did have foreign bodies noted within the wound which was thoroughly irrigated. Physical Exam  BP 135/88 (BP Location: Right Arm)   Pulse 85   Temp 98.6 F (37 C) (Oral)   Resp 16   Ht 6' 9 (2.057 m)   Wt (!) 162.8 kg   SpO2 99%   BMI 38.46 kg/m   Physical Exam  Procedures  Procedures  ED Course / MDM    Medical Decision Making Amount and/or Complexity of Data Reviewed Radiology: ordered.  Risk OTC drugs. Prescription drug management.   Repeat x-ray demonstrates resolution of the foreign bodies.  Patient is doing well at this time and is stable for discharge home.  Did reinforce the need for follow-up with orthopedics on an outpatient basis.  He does have full flexion and extension of the affected digit with each joint isolated with no indication for flexor tendon injury.  Please see previous providers note for procedure.  Strict turn precautions were provided for any new or worsening symptoms.  Patient voiced understand to the plan and had no additional questions.  Finger was bandaged and placed in a finger splint prior to DC.       Daralene Lonni BIRCH, PA-C 03/07/24 1753    Melvenia Motto, MD 03/07/24 2249

## 2024-04-13 ENCOUNTER — Other Ambulatory Visit: Payer: Self-pay | Admitting: Family Medicine

## 2024-05-03 ENCOUNTER — Other Ambulatory Visit: Payer: Self-pay | Admitting: Family Medicine

## 2024-05-03 DIAGNOSIS — F331 Major depressive disorder, recurrent, moderate: Secondary | ICD-10-CM

## 2024-06-02 ENCOUNTER — Other Ambulatory Visit: Payer: Self-pay | Admitting: Family Medicine

## 2024-06-02 DIAGNOSIS — F331 Major depressive disorder, recurrent, moderate: Secondary | ICD-10-CM
